# Patient Record
Sex: Male | Born: 1970
Health system: Southern US, Community
[De-identification: ages and names within clinical notes are randomized; demographics above are authoritative.]

## PROBLEM LIST (undated history)

## (undated) DIAGNOSIS — F329 Major depressive disorder, single episode, unspecified: Secondary | ICD-10-CM

## (undated) DIAGNOSIS — G4730 Sleep apnea, unspecified: Secondary | ICD-10-CM

## (undated) DIAGNOSIS — F419 Anxiety disorder, unspecified: Secondary | ICD-10-CM

## (undated) DIAGNOSIS — F32A Depression, unspecified: Secondary | ICD-10-CM

## (undated) DIAGNOSIS — E785 Hyperlipidemia, unspecified: Secondary | ICD-10-CM

## (undated) HISTORY — PX: APPENDECTOMY: SHX54

## (undated) HISTORY — DX: Anxiety disorder, unspecified: F41.9

## (undated) HISTORY — DX: Hyperlipidemia, unspecified: E78.5

## (undated) HISTORY — DX: Sleep apnea, unspecified: G47.30

## (undated) HISTORY — DX: Depression, unspecified: F32.A

## (undated) HISTORY — PX: ROTATOR CUFF REPAIR: SHX139

## (undated) HISTORY — DX: Major depressive disorder, single episode, unspecified: F32.9

---

## 2003-12-19 ENCOUNTER — Ambulatory Visit (HOSPITAL_BASED_OUTPATIENT_CLINIC_OR_DEPARTMENT_OTHER): Admission: RE | Admit: 2003-12-19 | Discharge: 2003-12-19 | Payer: Self-pay | Admitting: Orthopedic Surgery

## 2004-01-07 ENCOUNTER — Encounter (INDEPENDENT_AMBULATORY_CARE_PROVIDER_SITE_OTHER): Payer: Self-pay | Admitting: *Deleted

## 2004-01-07 ENCOUNTER — Ambulatory Visit (HOSPITAL_COMMUNITY): Admission: RE | Admit: 2004-01-07 | Discharge: 2004-01-07 | Payer: Self-pay | Admitting: Orthopedic Surgery

## 2004-01-07 ENCOUNTER — Ambulatory Visit (HOSPITAL_BASED_OUTPATIENT_CLINIC_OR_DEPARTMENT_OTHER): Admission: RE | Admit: 2004-01-07 | Discharge: 2004-01-07 | Payer: Self-pay | Admitting: Orthopedic Surgery

## 2004-01-09 HISTORY — PX: FINGER AMPUTATION: SHX636

## 2008-10-17 ENCOUNTER — Encounter: Admission: RE | Admit: 2008-10-17 | Discharge: 2008-10-17 | Payer: Self-pay | Admitting: Neurosurgery

## 2008-11-05 ENCOUNTER — Encounter: Admission: RE | Admit: 2008-11-05 | Discharge: 2008-11-05 | Payer: Self-pay | Admitting: Neurosurgery

## 2009-01-07 ENCOUNTER — Encounter: Admission: RE | Admit: 2009-01-07 | Discharge: 2009-01-07 | Payer: Self-pay | Admitting: Neurosurgery

## 2010-09-15 ENCOUNTER — Other Ambulatory Visit: Payer: Self-pay | Admitting: Orthopedic Surgery

## 2010-11-26 NOTE — Op Note (Signed)
NAME:  Dylan Hall, Dylan Hall                          ACCOUNT NO.:  [id number]   MEDICAL RECORD NO.:  [id number]                   PATIENT TYPE:  AMB   LOCATION:  DSC                                  FACILITY:  [facility]   PHYSICIAN:  Cindee Salt, M.D.                    DATE OF BIRTH:  10-03-1970   DATE OF PROCEDURE:  12/19/2003  DATE OF DISCHARGE:                                 OPERATIVE REPORT   PREOPERATIVE DIAGNOSIS:  Status post crush injury of left index finger.   POSTOPERATIVE DIAGNOSIS:  Status post crush injury of left index finger.   OPERATION PERFORMED:  Re-repair of middle phalanx fracture, left index  finger; repair of flexor sheath A4 pulley, repair of ulnar digital artery  and volar vein, left  index finger.   SURGEON:  Cindee Salt, M.D.   ASSISTANTCarolyne Fiscal.   ANESTHESIA:  Axillary block.   INDICATIONS FOR PROCEDURE:  The patient is a 40 year old male who suffered a  severe crush injury to his left index finger. He was seen elsewhere where  pinning was performed.  He was told that the finger may not survive due to  the crush.  He was seen in the office this morning with questionable  circulation to the tip of the laceration.  X-rays revealed that the fracture  is not fully reduced.   DESCRIPTION OF PROCEDURE:  The patient was brought to the operating room  where an axillary block was carried out without difficulty.  He was prepped  using Betadine scrub and solution, left arm free in supine position.  The  limb was elevated for exsanguination and an Esmarch bandage was used from  the wrist proximally.  Tourniquet placed high on the arm was inflated to 250  mmHg.  The sutures were removed.  The wound was opened.  The radial digital  artery and nerve were found to be intact as was the ulnar digital nerve.  The ulnar digital artery which was the major artery was found to be  lacerated.  The radial digital artery was extremely small.  The pins were  removed.  The fracture was  then manipulated into position, reduced and  pinned with two 35 K-wires under image intensification.  The flexor tendon  was found to be intact.  Flexor sheath was ruptured.  This was then repaired  with figure-of-eight 4-0 Mersilene sutures.  The operating microscope was  brought into position. The ends of the artery were identified on the ulnar  side.  These were cut back until normal intima was identified.  A repair was  then performed with back wall first technique with interrupted 9-0 nylon  sutures.  A large volar vein was also present.  This was cut back to normal  intimal.  Repair was then performed, again using back wall first technique  with interrupted 9-0 nylon sutures.  The tourniquet was deflated. The  tip of  the finger pinked.  The wound was irrigated and the skin closed with  interrupted 5-0 nylon sutures.  A sterile bulky dressing and splint were  applied.  The patient tolerated the procedure well and was taken to the  recovery room for observation in satisfactory condition.  He is discharged  to home to return to the [facility] [place] in one week on Percocet,  Keflex, aspirin and strict instructions to quit smoking.                                               Cindee Salt, M.D.   GK/MEDQ  D:  12/19/2003  T:  12/20/2003  Job:  [id number]

## 2010-11-26 NOTE — Op Note (Signed)
NAME:  Dylan Hall, Dylan Hall                          ACCOUNT NO.:  [id number]   MEDICAL RECORD NO.:  [id number]                   PATIENT TYPE:  AMB   LOCATION:  DSC                                  FACILITY:  [facility]   PHYSICIAN:  Cindee Salt, M.D.                    DATE OF BIRTH:  1970/10/30   DATE OF PROCEDURE:  01/07/2004  DATE OF DISCHARGE:                                 OPERATIVE REPORT   PREOPERATIVE DIAGNOSIS:  Status post crush injury, left index finger, with  revascularization failure.   POSTOPERATIVE DIAGNOSIS:  Status post crush injury, left index finger, with  revascularization failure.   OPERATION:  Revision amputation, left index finger.   SURGEON:  Cindee Salt, M.D.   ASSISTANTCarolyne Fiscal.   ANESTHESIA:  Forearm-based IV regional.   HISTORY:  The patient is a 40 year old male who suffered a crush injury to  his left index finger.  He has undergone revascularization.  This, however,  has not survived.   PROCEDURE:  The patient was brought to the operating room, where a forearm-  based IV regional anesthetic was carried out without difficulty.  He was  prepped using Duraprep in supine position, left arm free.  The limb was  exsanguinated with an Esmarch bandage, a tourniquet placed on the arm was  inflated to 250 mmHg.  The sutures were removed.  The area of demarcation  was immediately apparent.  With sharp dissection this was transected, the  entire digit removed after removal of the pins.  The wound was copiously  irrigated with saline.  Nerves were cut back and allowed to retract.  The  flexor tendons were allowed to retract.  The bone was maintained at its  present length, minimally debrided.  The wound was then loosely closed with  interrupted 4-0 chromic sutures.  A sterile compressive dressing and splint  were applied.  The patient tolerated the procedure well and was taken to the  recovery room for observation in satisfactory condition.  He is discharged  home to  return to the [facility] [place] in one week on Vicodin and  Keflex.                                               Cindee Salt, M.D.    Angelique Blonder  D:  01/07/2004  T:  01/07/2004  Job:  [id number]

## 2013-06-20 ENCOUNTER — Ambulatory Visit (INDEPENDENT_AMBULATORY_CARE_PROVIDER_SITE_OTHER): Payer: 59 | Admitting: Podiatry

## 2013-06-20 ENCOUNTER — Ambulatory Visit (INDEPENDENT_AMBULATORY_CARE_PROVIDER_SITE_OTHER): Payer: 59

## 2013-06-20 ENCOUNTER — Encounter: Payer: Self-pay | Admitting: Podiatry

## 2013-06-20 DIAGNOSIS — M7732 Calcaneal spur, left foot: Secondary | ICD-10-CM

## 2013-06-20 DIAGNOSIS — M79609 Pain in unspecified limb: Secondary | ICD-10-CM

## 2013-06-20 DIAGNOSIS — M722 Plantar fascial fibromatosis: Secondary | ICD-10-CM

## 2013-06-20 DIAGNOSIS — M7730 Calcaneal spur, unspecified foot: Secondary | ICD-10-CM

## 2013-06-20 MED ORDER — TRIAMCINOLONE ACETONIDE 10 MG/ML IJ SUSP
10.0000 mg | Freq: Once | INTRAMUSCULAR | Status: AC
  Administered 2013-06-20: 10 mg

## 2013-06-20 MED ORDER — DICLOFENAC SODIUM 75 MG PO TBEC: 75.0000 mg | DELAYED_RELEASE_TABLET | Freq: Two times a day (BID) | ORAL | Status: DC

## 2013-06-20 NOTE — Progress Notes (Signed)
   Subjective:    Patient ID: Dylan Hall, male    DOB: 1970-07-24, 42 y.o.   MRN: [id number]  HPI N heel pain        L Left plantar heel         D 6 months        O 6 years        C pain and burning, and a black spot        A ladder climbing, concrete        T old rx orthotics, and nightsplint off the internet, ice massage         Review of Systems  Constitutional: Negative.   HENT: Negative.   Eyes: Negative.   Respiratory: Negative.   Cardiovascular: Positive for leg swelling.  Gastrointestinal: Negative.   Endocrine: Negative.   Genitourinary: Negative.   Musculoskeletal: Positive for arthralgias.  Skin: Negative.   Allergic/Immunologic:       Sinus problems  Neurological: Negative.   Hematological: Negative.   Psychiatric/Behavioral: Negative.        Objective:   Physical Exam        Assessment & Plan:

## 2013-06-20 NOTE — Progress Notes (Signed)
Subjective:     Patient ID: Dylan Hall, male   DOB: [birth year], 42 y.o.   MRN: [id number]  HPI patient presents stating I have had a long-term history of heel pain left and this latest episode has been 6 months and it seems like it is getting worse. I also noted a little spot on the bottom of my left heel that I wanted you to look at   Review of Systems  All other systems reviewed and are negative.       Objective:   Physical Exam  Nursing note and vitals reviewed. Constitutional: He is oriented to person, place, and time.  Cardiovascular: Intact distal pulses.   Musculoskeletal: Normal range of motion.  Neurological: He is oriented to person, place, and time.  Skin: Skin is warm.   neurovascular status intact with muscle strength adequate and no equinus condition noted. Patient is found to have intense discomfort plantar aspect left heel at the insertional point of the tendon into the calcaneus with inflammation and fluid noted. There is a very small black spot which appears to be dried blood from probable trauma that he just noticed in the last couple days     Assessment:     Plantar fasciitis left heel with inflammation and fluid of the medial band of a long-term nature with acute condition occurring at this time    Plan:     Reviewed condition and explained to him chronic plantar fasciitis. At this time I injected the left plantar fascia 3 months Kenalog 5 of Xylocaine Marcaine mixture dispensed night splint with instructions on usage and scanned for new orthotics as his old orthotics have lost her ability to hold his arch. Placed on Voltaren 75 mg twice a day and reappoint when orthotics are returned

## 2013-07-31 ENCOUNTER — Encounter: Payer: Self-pay | Admitting: Podiatry

## 2013-07-31 ENCOUNTER — Ambulatory Visit (INDEPENDENT_AMBULATORY_CARE_PROVIDER_SITE_OTHER): Payer: 59 | Admitting: Podiatry

## 2013-07-31 ENCOUNTER — Ambulatory Visit: Payer: 59 | Admitting: Podiatry

## 2013-07-31 VITALS — BP 119/78 | HR 78 | Resp 17 | Ht 76.0 in | Wt 265.0 lb

## 2013-07-31 DIAGNOSIS — M722 Plantar fascial fibromatosis: Secondary | ICD-10-CM

## 2013-07-31 MED ORDER — TRIAMCINOLONE ACETONIDE 10 MG/ML IJ SUSP
10.0000 mg | Freq: Once | INTRAMUSCULAR | Status: AC
  Administered 2013-07-31: 10 mg

## 2013-07-31 NOTE — Progress Notes (Signed)
Subjective:     Patient ID: Dylan Hall, male   DOB: 1970-09-16, 43 y.o.   MRN: [id number]  HPI patient states that his left heel has started to hurt him again and he's been wearing his orthotics at work which have been helpful but he is having pain in his heel at this time   Review of Systems     Objective:   Physical Exam Neurovascular status intact with inflammation and pain center lateral and medial left heel    Assessment:     Severe plantar fasciitis left heel    Plan:     Discussed the importance of physical therapy and today injected the left plantar fascia 3 mg Kenalog 5 g like Marcaine mixture and discussed ice therapy night splint and orthotics. Reappoint 4 weeks

## 2013-07-31 NOTE — Progress Notes (Signed)
   Subjective:    Patient ID: Dylan Hall, male    DOB: Jun 26, 1971, 43 y.o.   MRN: [id number] Pt states the last injection pain relief did not last 2 days.  Pt complains of more burning. HPI    Review of Systems     Objective:   Physical Exam        Assessment & Plan:

## 2014-12-10 ENCOUNTER — Other Ambulatory Visit: Payer: Self-pay | Admitting: Orthopaedic Surgery

## 2014-12-10 DIAGNOSIS — M542 Cervicalgia: Secondary | ICD-10-CM

## 2015-01-01 ENCOUNTER — Ambulatory Visit
Admission: RE | Admit: 2015-01-01 | Discharge: 2015-01-01 | Disposition: A | Discharge status: Home / Self Care | Payer: 59 | Source: Ambulatory Visit | Attending: Orthopaedic Surgery | Admitting: Orthopaedic Surgery

## 2015-01-01 ENCOUNTER — Other Ambulatory Visit: Payer: Self-pay

## 2015-01-01 DIAGNOSIS — M542 Cervicalgia: Secondary | ICD-10-CM

## 2015-07-06 ENCOUNTER — Ambulatory Visit (INDEPENDENT_AMBULATORY_CARE_PROVIDER_SITE_OTHER): Payer: 59 | Admitting: Podiatry

## 2015-07-06 ENCOUNTER — Ambulatory Visit (INDEPENDENT_AMBULATORY_CARE_PROVIDER_SITE_OTHER): Payer: 59

## 2015-07-06 ENCOUNTER — Encounter: Payer: Self-pay | Admitting: Podiatry

## 2015-07-06 DIAGNOSIS — M722 Plantar fascial fibromatosis: Secondary | ICD-10-CM

## 2015-07-06 MED ORDER — TRIAMCINOLONE ACETONIDE 10 MG/ML IJ SUSP
10.0000 mg | Freq: Once | INTRAMUSCULAR | Status: AC
  Administered 2015-07-06: 10 mg

## 2015-07-06 NOTE — Progress Notes (Signed)
Subjective:     Patient ID: Dylan Hall, male   DOB: 11/12/1970, 44 y.o.   MRN: [id number]  HPI patient presents with a small lesion underneath the right arch which has been bothering him for the last several months. He does not feel it's been growing but it is tender   Review of Systems     Objective:   Physical Exam Neurovascular status intact with nodule in the mid arch area right measuring approximately 5 x 5 mm that's painful to direct pressure    Assessment:     Probable plantar fibroma with inflammatory fasciitis or possible cyst    Plan:     Injected around the area 3 mg Kenalog 5 mill grams Xylocaine and advised on heat and if it should continue to be painful should grow in size change color it will require excision reviewed x-rays with patient

## 2016-08-08 ENCOUNTER — Encounter: Payer: Self-pay | Admitting: Adult Health

## 2016-08-08 ENCOUNTER — Ambulatory Visit (INDEPENDENT_AMBULATORY_CARE_PROVIDER_SITE_OTHER): Payer: 59 | Admitting: Adult Health

## 2016-08-08 VITALS — BP 132/87 | HR 89 | Ht 74.5 in | Wt 256.2 lb

## 2016-08-08 DIAGNOSIS — E785 Hyperlipidemia, unspecified: Secondary | ICD-10-CM | POA: Insufficient documentation

## 2016-08-08 DIAGNOSIS — E663 Overweight: Secondary | ICD-10-CM | POA: Diagnosis not present

## 2016-08-08 DIAGNOSIS — E7800 Pure hypercholesterolemia, unspecified: Secondary | ICD-10-CM

## 2016-08-08 DIAGNOSIS — F17200 Nicotine dependence, unspecified, uncomplicated: Secondary | ICD-10-CM | POA: Diagnosis not present

## 2016-08-08 DIAGNOSIS — F339 Major depressive disorder, recurrent, unspecified: Secondary | ICD-10-CM

## 2016-08-08 DIAGNOSIS — Z833 Family history of diabetes mellitus: Secondary | ICD-10-CM

## 2016-08-08 DIAGNOSIS — M542 Cervicalgia: Secondary | ICD-10-CM

## 2016-08-08 MED ORDER — DULOXETINE HCL 30 MG PO CPEP: 30.0000 mg | ORAL_CAPSULE | Freq: Every day | ORAL | 0 refills | Status: DC

## 2016-08-08 MED ORDER — ROSUVASTATIN CALCIUM 20 MG PO TABS: 20.0000 mg | ORAL_TABLET | Freq: Every day | ORAL | 1 refills | Status: DC

## 2016-08-08 NOTE — Assessment & Plan Note (Signed)
Reduce saturated fat intake.  Continue medication as directed.

## 2016-08-08 NOTE — Assessment & Plan Note (Signed)
Perform ROM exercises.  OTC Ibuprofen as directed by manufacturer's instructions.

## 2016-08-08 NOTE — Assessment & Plan Note (Signed)
Declined smoking cessation at this time.

## 2016-08-08 NOTE — Patient Instructions (Addendum)
Steps to Quit Smoking Smoking tobacco can be bad for your health. It can also affect almost every organ in your body. Smoking puts you and people around you at risk for many serious long-lasting (chronic) diseases. Quitting smoking is hard, but it is one of the best things that you can do for your health. It is never too late to quit. What are the benefits of quitting smoking? When you quit smoking, you lower your risk for getting serious diseases and conditions. They can include:  Lung cancer or lung disease.  Heart disease.  Stroke.  Heart attack.  Not being able to have children (infertility).  Weak bones (osteoporosis) and broken bones (fractures). If you have coughing, wheezing, and shortness of breath, those symptoms may get better when you quit. You may also get sick less often. If you are pregnant, quitting smoking can help to lower your chances of having a baby of low birth weight. What can I do to help me quit smoking? Talk with your doctor about what can help you quit smoking. Some things you can do (strategies) include:  Quitting smoking totally, instead of slowly cutting back how much you smoke over a period of time.  Going to in-person counseling. You are more likely to quit if you go to many counseling sessions.  Using resources and support systems, such as:  Online chats with a counselor.  Phone quitlines.  Printed self-help materials.  Support groups or group counseling.  Text messaging programs.  Mobile phone apps or applications.  Taking medicines. Some of these medicines may have nicotine in them. If you are pregnant or breastfeeding, do not take any medicines to quit smoking unless your doctor says it is okay. Talk with your doctor about counseling or other things that can help you. Talk with your doctor about using more than one strategy at the same time, such as taking medicines while you are also going to in-person counseling. This can help make quitting  easier. What things can I do to make it easier to quit? Quitting smoking might feel very hard at first, but there is a lot that you can do to make it easier. Take these steps:  Talk to your family and friends. Ask them to support and encourage you.  Call phone quitlines, reach out to support groups, or work with a counselor.  Ask people who smoke to not smoke around you.  Avoid places that make you want (trigger) to smoke, such as:  Bars.  Parties.  Smoke-break areas at work.  Spend time with people who do not smoke.  Lower the stress in your life. Stress can make you want to smoke. Try these things to help your stress:  Getting regular exercise.  Deep-breathing exercises.  Yoga.  Meditating.  Doing a body scan. To do this, close your eyes, focus on one area of your body at a time from head to toe, and notice which parts of your body are tense. Try to relax the muscles in those areas.  Download or buy apps on your mobile phone or tablet that can help you stick to your quit plan. There are many free apps, such as QuitGuide from the CDC (Centers for Disease Control and Prevention). You can find more support from smokefree.gov and other websites. This information is not intended to replace advice given to you by your health care provider. Make sure you discuss any questions you have with your health care provider. Document Released: 04/23/2009 Document Revised: 02/23/2016 Document   Reviewed: 11/11/2014 Elsevier Interactive Patient Education  2017 [employer]. Cholesterol Cholesterol is a fat. Your body needs a small amount of cholesterol. Cholesterol (plaque) may build up in your blood vessels (arteries). That makes you more likely to have a heart attack or stroke. You cannot feel your cholesterol level. Having a blood test is the only way to find out if your level is high. Keep your test results. Work with your doctor to keep your cholesterol at a good level. What do the results  mean?  Total cholesterol is how much cholesterol is in your blood.  LDL is bad cholesterol. This is the type that can build up. Try to have low LDL.  HDL is good cholesterol. It cleans your blood vessels and carries LDL away. Try to have high HDL.  Triglycerides are fat that the body can store or burn for energy. What are good levels of cholesterol?  Total cholesterol below 200.  LDL below 100 is good for people who have health risks. LDL below 70 is good for people who have very high risks.  HDL above 40 is good. It is best to have HDL of 60 or higher.  Triglycerides below 150. How can I lower my cholesterol? Diet  Follow your diet program as told by your doctor.  Choose fish, white meat chicken, or [place] that is roasted or baked. Try not to eat red meat, fried foods, sausage, or lunch meats.  Eat lots of fresh fruits and vegetables.  Choose whole grains, beans, pasta, potatoes, and cereals.  Choose olive oil, corn oil, or canola oil. Only use small amounts.  Try not to eat butter, mayonnaise, shortening, or palm kernel oils.  Try not to eat foods with trans fats.  Choose low-fat or nonfat dairy foods.  Drink skim or nonfat milk.  Eat low-fat or nonfat yogurt and cheeses.  Try not to drink whole milk or cream.  Try not to eat ice cream, egg yolks, or full-fat cheeses.  Healthy desserts include angel food cake, ginger snaps, animal crackers, hard candy, popsicles, and low-fat or nonfat frozen yogurt. Try not to eat pastries, cakes, pies, and cookies. Exercise  Follow your exercise program as told by your doctor.  Be more active. Try gardening, walking, and taking the stairs.  Ask your doctor about ways that you can be more active. Medicine  Take over-the-counter and prescription medicines only as told by your doctor. This information is not intended to replace advice given to you by your health care provider. Make sure you discuss any questions you have with  your health care provider. Document Released: 09/23/2008 Document Revised: 01/27/2016 Document Reviewed: 01/07/2016 Elsevier Interactive Patient Education  2017 [employer]. Persistent Depressive Disorder Persistent depressive disorder (PDD) is a mental health condition. PDD causes symptoms of low-level depression for 2 years or longer. It may also be called long-term (chronic) depression or dysthymia. PDD may include episodes of more severe depression that last for about 2 weeks (major depressive disorder or MDD). PDD can affect the way you think, feel, and sleep. This condition may also affect your relationships. You may be more likely to get sick if you have PDD. Symptoms of PDD occur for most of the day and may include:  Feeling tired (fatigue).  Low energy.  Eating too much or too little.  Sleeping too much or too little.  Feeling restless or agitated.  Feeling hopeless.  Feeling worthless or guilty.  Feeling worried or nervous (anxiety).  Trouble concentrating or  making decisions.  Low self-esteem.  A negative way of looking at things (out[place]).  Not being able to have fun or feel pleasure.  Avoiding interacting with people.  Getting angry or annoyed easily (irritability).  Acting aggressive or angry. Follow these instructions at home: Activity  Go back to your normal activities as told by your doctor.  Exercise regularly as told by your doctor. General instructions  Take over-the-counter and prescription medicines only as told by your doctor.  Do not drink alcohol. Or, limit how much alcohol you drink to no more than 1 drink a day for nonpregnant women and 2 drinks a day for men. One drink equals 12 oz of beer, 5 oz of wine, or 1 oz of hard liquor. Alcohol can affect any antidepressant medicines you are taking. Talk with your doctor about your alcohol use.  Eat a healthy diet and get plenty of sleep.  Find activities that you enjoy each day.  Consider  joining a support group. Your doctor may be able to suggest a support group.  Keep all follow-up visits as told by your doctor. This is important. Where to find more information: [employer] on Mental Illness  www.nami.org [place] National Institute of Mental Health  https://carter.com/ National Suicide Prevention Lifeline  334-305-8528 564-196-9245). This is free, 24-hour help. Contact a doctor if:  Your symptoms get worse.  You have new symptoms.  You have trouble sleeping or doing your daily activities. Get help right away if:  You self-harm.  You have serious thoughts about hurting yourself or others.  You see, hear, taste, smell, or feel things that are not there (hallucinate). This information is not intended to replace advice given to you by your health care provider. Make sure you discuss any questions you have with your health care provider. Document Released: 06/08/2015 Document Revised: 02/19/2016 Document Reviewed: 02/19/2016 Elsevier Interactive Patient Education  2017 Else[employer] Inc.   Cervical Strain and Sprain Rehab Ask your health care provider which exercises are safe for you. Do exercises exactly as told by your health care provider and adjust them as directed. It is normal to feel mild stretching, pulling, tightness, or discomfort as you do these exercises, but you should stop right away if you feel sudden pain or your pain gets worse.Do not begin these exercises until told by your health care provider. Stretching and range of motion exercises These exercises warm up your muscles and joints and improve the movement and flexibility of your neck. These exercises also help to relieve pain, numbness, and tingling. Exercise A: Cervical side bend 1. Using good posture, sit on a stable chair or stand up. 2. Without moving your shoulders, slowly tilt your left / right ear to your shoulder until you feel a stretch in your neck muscles. You should be looking  straight ahead. 3. Hold for __________ seconds. 4. Repeat with the other side of your neck. Repeat __________ times. Complete this exercise __________ times a day. Exercise B: Cervical rotation 1. Using good posture, sit on a stable chair or stand up. 2. Slowly turn your head to the side as if you are looking over your left / right shoulder.  Keep your eyes level with the ground.  Stop when you feel a stretch along the side and the back of your neck. 3. Hold for __________ seconds. 4. Repeat this by turning to your other side. Repeat __________ times. Complete this exercise __________ times a day. Exercise C: Thoracic extension and pectoral stretch 1. Roll  a towel or a small blanket so it is about 4 inches (10 cm) in diameter. 2. Lie down on your back on a firm surface. 3. Put the towel lengthwise, under your spine in the middle of your back. It should not be not under your shoulder blades. The towel should line up with your spine from your middle back to your lower back. 4. Put your hands behind your head and let your elbows fall out to your sides. 5. Hold for __________ seconds. Repeat __________ times. Complete this exercise __________ times a day. Strengthening exercises These exercises build strength and endurance in your neck. Endurance is the ability to use your muscles for a long time, even after your muscles get tired. Exercise D: Upper cervical flexion, isometric 1. Lie on your back with a thin pillow behind your head and a small rolled-up towel under your neck. 2. Gently tuck your chin toward your chest and nod your head down to look toward your feet. Do not lift your head off the pillow. 3. Hold for __________ seconds. 4. Release the tension slowly. Relax your neck muscles completely before you repeat this exercise. Repeat __________ times. Complete this exercise __________ times a day. Exercise E: Cervical extension, isometric 1. Stand about 6 inches (15 cm) away from a  wall, with your back facing the wall. 2. Place a soft object, about 6-8 inches (15-20 cm) in diameter, between the back of your head and the wall. A soft object could be a small pillow, a ball, or a folded towel. 3. Gently tilt your head back and press into the soft object. Keep your jaw and forehead relaxed. 4. Hold for __________ seconds. 5. Release the tension slowly. Relax your neck muscles completely before you repeat this exercise. Repeat __________ times. Complete this exercise __________ times a day. Posture and body mechanics   Body mechanics refers to the movements and positions of your body while you do your daily activities. Posture is part of body mechanics. Good posture and healthy body mechanics can help to relieve stress in your body's tissues and joints. Good posture means that your spine is in its natural S-curve position (your spine is neutral), your shoulders are pulled back slightly, and your head is not tipped forward. The following are general guidelines for applying improved posture and body mechanics to your everyday activities. Standing  When standing, keep your spine neutral and keep your feet about hip-width apart. Keep a slight bend in your knees. Your ears, shoulders, and hips should line up.  When you do a task in which you stand in one place for a long time, place one foot up on a stable object that is 2-4 inches (5-10 cm) high, such as a footstool. This helps keep your spine neutral. Sitting  When sitting, keep your spine neutral and your keep feet flat on the floor. Use a footrest, if necessary, and keep your thighs parallel to the floor. Avoid rounding your shoulders, and avoid tilting your head forward.  When working at a desk or a computer, keep your desk at a height where your hands are slightly lower than your elbows. Slide your chair under your desk so you are close enough to maintain good posture.  When working at a computer, place your monitor at a height  where you are looking straight ahead and you do not have to tilt your head forward or downward to look at the screen. Resting When lying down and resting, avoid positions that are  most painful for you. Try to support your neck in a neutral position. You can use a contour pillow or a small rolled-up towel. Your pillow should support your neck but not push on it. This information is not intended to replace advice given to you by your health care provider. Make sure you discuss any questions you have with your health care provider. Document Released: 06/27/2005 Document Revised: 03/03/2016 Document Reviewed: 06/03/2015 Elsevier Interactive Patient Education  2017 [employer].   Please return for fasting labs. Continue medications as directed. Please return annually, or sooner if needed.

## 2016-08-08 NOTE — Assessment & Plan Note (Signed)
Continue current medication.  Does not request addition of CBT at this time.

## 2016-08-08 NOTE — Assessment & Plan Note (Signed)
Increase regular exercise.  Reduce fat and sugar intake.  Not interested in smoking cessation this time.

## 2016-08-08 NOTE — Assessment & Plan Note (Signed)
Reduce sugar and CHO intake.  Increase regular exercise.

## 2016-08-08 NOTE — Progress Notes (Signed)
Subjective:    Patient ID: Dylan Hall, male    DOB: [birth year], 46 y.o.   MRN: [id number]  HPI:.  Dylan Hall presents to establish as a new pt. PMH:  Chronic cervical neck pain r/t repetitive use, no acute injury/trauma.  Treated with OTC NSAIDs and cortisone injections-2010, 2016.   Depression-treated with medication and strong family support system. Elevated cholesterol-treated with medication, reports still eating foods high in saturated fat and sodium. Tobacco Use-"I am not interested in quitting right now, or probably ever". Intermittent numbness/tingling in left/right 4th/5th fingers.  Not present at today's encounter. He lives with wife of 64 years and teenage grandson.   Patient Care Team    Relationship Specialty Notifications Start End  Odella Aquas, NP PCP - General Family Medicine  08/08/16     Patient Active Problem List   Diagnosis Date Noted  . Depression, recurrent ([place]) 08/08/2016  . Elevated cholesterol 08/08/2016  . Overweight on examination 08/08/2016  . Tobacco use disorder 08/08/2016  . Neck pain 08/08/2016  . Family history of diabetes mellitus in mother 08/08/2016     Past Medical History:  Diagnosis Date  . Anxiety   . Depression   . Hyperlipidemia      Past Surgical History:  Procedure Laterality Date  . FINGER AMPUTATION Left 01/2004   index     Family History  Problem Relation Age of Onset  . Diabetes Mother   . Heart disease Mother   . Heart attack Mother   . Dementia Mother   . Alcohol abuse Father   . Hypertension Maternal Aunt   . Dementia Maternal Aunt   . Alcohol abuse Maternal Uncle   . Heart attack Maternal Uncle   . Hypertension Maternal Uncle   . Dementia Maternal Uncle   . Alcohol abuse Paternal Uncle      History  Drug Use No     History  Alcohol Use  . 1.2 oz/week  . 2 Cans of beer per week     History  Smoking Status  . Current Some Day Smoker  . Packs/day: 1.00  . Years: 26.00  . Types:  Cigarettes  Smokeless Tobacco  . Current User  . Types: Snuff     Outpatient Encounter Prescriptions as of 08/08/2016  Medication Sig  . rosuvastatin (CRESTOR) 20 MG tablet Take 1 tablet (20 mg total) by mouth daily.  . [DISCONTINUED] rosuvastatin (CRESTOR) 20 MG tablet Take 20 mg by mouth daily.  . DULoxetine (CYMBALTA) 30 MG capsule Take 1 capsule (30 mg total) by mouth daily.  . [DISCONTINUED] aspirin 81 MG tablet Take 81 mg by mouth daily.  . [DISCONTINUED] citalopram (CELEXA) 40 MG tablet Take 40 mg by mouth daily. Reported on 07/06/2015  . [DISCONTINUED] diclofenac (VOLTAREN) 75 MG EC tablet Take 1 tablet (75 mg total) by mouth 2 (two) times daily. (Patient not taking: Reported on 07/06/2015)  . [DISCONTINUED] DULoxetine (CYMBALTA) 20 MG capsule Take 20 mg by mouth daily.  . [DISCONTINUED] DULoxetine (CYMBALTA) 30 MG capsule Take 30 mg by mouth daily.   No facility-administered encounter medications on file as of 08/08/2016.     Allergies: Codeine  Body mass index is 32.45 kg/m.  Blood pressure 132/87, pulse 89, height 6' 2.5" (1.892 m), weight 256 lb 3.2 oz (116.2 kg).   Review of Systems  Constitutional: Negative for activity change, appetite change, chills, diaphoresis, fatigue, fever and unexpected weight change.  HENT: Negative for congestion, postnasal drip, trouble swallowing and  voice change.   Eyes: Negative for visual disturbance.  Respiratory: Positive for shortness of breath. Negative for apnea, cough, choking, chest tightness, wheezing and stridor.        Pack a day for 25 years.  SOB when smoking.  Denies SOB at rest.  Cardiovascular: Negative for chest pain, palpitations and leg swelling.  Gastrointestinal: Negative for constipation, diarrhea and vomiting.  Endocrine: Negative for cold intolerance, heat intolerance and polydipsia.  Genitourinary: Negative for difficulty urinating and flank pain.  Musculoskeletal: Positive for arthralgias, myalgias, neck pain  and neck stiffness. Negative for back pain, gait problem and joint swelling.  Skin: Negative for color change, pallor, rash and wound.  Neurological: Negative for dizziness, tremors, syncope, speech difficulty, weakness and headaches.  Psychiatric/Behavioral: Negative for agitation, behavioral problems, confusion, decreased concentration, dysphoric mood, self-injury, sleep disturbance and suicidal ideas. The patient is not nervous/anxious.        Objective:   Physical Exam  Constitutional: He is oriented to person, place, and time. He appears well-developed and well-nourished. No distress.  HENT:  Head: Atraumatic.  Right Ear: External ear normal.  Left Ear: External ear normal.  Eyes: Conjunctivae and EOM are normal. Pupils are equal, round, and reactive to light.  Neck: Normal range of motion. Neck supple. No tracheal deviation present. No thyromegaly present.  Tenderness with palpation over posterior cervical neck.  No step off's noted.   Cardiovascular: Normal rate, regular rhythm, normal heart sounds and intact distal pulses.   Pulmonary/Chest: Effort normal and breath sounds normal. No respiratory distress. He has no wheezes. He has no rales. He exhibits no tenderness.  Abdominal: Soft. Bowel sounds are normal.  Musculoskeletal: Normal range of motion. He exhibits tenderness and deformity.       Right shoulder: He exhibits tenderness, crepitus and pain. He exhibits no spasm, normal pulse and normal strength.       Cervical back: He exhibits tenderness and pain. He exhibits no swelling, no edema, no deformity, no laceration and no spasm.       Right hand: He exhibits no tenderness and normal capillary refill. Decreased sensation is not present in the ulnar distribution, is not present in the medial distribution and is not present in the radial distribution. He exhibits no thumb/finger opposition.       Left hand: He exhibits deformity. He exhibits no tenderness and normal capillary  refill. Decreased sensation is not present in the ulnar distribution, is not present in the medial redistribution and is not present in the radial distribution. He exhibits no thumb/finger opposition.  Bilaterally negative Phalen's sign.  Lymphadenopathy:    He has no cervical adenopathy.  Neurological: He is alert and oriented to person, place, and time. He has normal reflexes.  Skin: Skin is warm and dry. No rash noted. He is not diaphoretic. No erythema. No pallor.  Psychiatric: He has a normal mood and affect. His behavior is normal. Judgment and thought content normal.  Nursing note and vitals reviewed.   Tor Tsuda d. Bess, NP-C     Assessment & Plan:   1. Tobacco use disorder   2. Depression, recurrent ([place])   3. Elevated cholesterol   4. Overweight on examination   5. Neck pain   6. Family history of diabetes mellitus in mother     Depression, recurrent ([place]) Continue current medication.  Does not request addition of CBT at this time.  Elevated cholesterol Reduce saturated fat intake.  Continue medication as directed.  Overweight on examination Increase  regular exercise.  Reduce fat and sugar intake.  Not interested in smoking cessation this time.   Tobacco use disorder Declined smoking cessation at this time.   Neck pain Perform ROM exercises.  OTC Ibuprofen as directed by manufacturer's instructions.   Family history of diabetes mellitus in mother Reduce sugar and CHO intake.  Increase regular exercise.     FOLLOW-UP:  Return for fasting labs ASAP. Return in about 1 year (around 08/08/2017) for Regular Follow Up.

## 2016-08-17 ENCOUNTER — Other Ambulatory Visit: Payer: 59

## 2016-08-19 ENCOUNTER — Other Ambulatory Visit (INDEPENDENT_AMBULATORY_CARE_PROVIDER_SITE_OTHER): Payer: 59

## 2016-08-19 DIAGNOSIS — F17200 Nicotine dependence, unspecified, uncomplicated: Secondary | ICD-10-CM

## 2016-08-19 DIAGNOSIS — E7800 Pure hypercholesterolemia, unspecified: Secondary | ICD-10-CM | POA: Diagnosis not present

## 2016-08-19 DIAGNOSIS — F339 Major depressive disorder, recurrent, unspecified: Secondary | ICD-10-CM | POA: Diagnosis not present

## 2016-08-19 DIAGNOSIS — E663 Overweight: Secondary | ICD-10-CM

## 2016-08-19 DIAGNOSIS — M542 Cervicalgia: Secondary | ICD-10-CM

## 2016-08-19 DIAGNOSIS — Z833 Family history of diabetes mellitus: Secondary | ICD-10-CM

## 2016-08-20 LAB — COMPREHENSIVE METABOLIC PANEL
A/G RATIO: 2.4 — AB (ref 1.2–2.2)
ALK PHOS: 73 IU/L (ref 39–117)
ALT: 28 IU/L (ref 0–44)
AST: 21 IU/L (ref 0–40)
Albumin: 4.7 g/dL (ref 3.5–5.5)
BUN/Creatinine Ratio: 15 (ref 9–20)
BUN: 12 mg/dL (ref 6–24)
Bilirubin Total: 0.3 mg/dL (ref 0.0–1.2)
CALCIUM: 9.3 mg/dL (ref 8.7–10.2)
CO2: 22 mmol/L (ref 18–29)
CREATININE: 0.82 mg/dL (ref 0.76–1.27)
Chloride: 101 mmol/L (ref 96–106)
GFR calc Af Amer: 123 mL/min/{1.73_m2} (ref >59)
GFR, EST NON AFRICAN AMERICAN: 106 mL/min/{1.73_m2} (ref >59)
Globulin, Total: 2 g/dL (ref 1.5–4.5)
Glucose: 80 mg/dL (ref 65–99)
POTASSIUM: 4.3 mmol/L (ref 3.5–5.2)
Sodium: 140 mmol/L (ref 134–144)
Total Protein: 6.7 g/dL (ref 6.0–8.5)

## 2016-08-20 LAB — CBC WITH DIFFERENTIAL/PLATELET
Basophils Absolute: 0.1 10*3/uL (ref 0.0–0.2)
Basos: 1 %
EOS (ABSOLUTE): 0.6 10*3/uL — AB (ref 0.0–0.4)
Eos: 6 %
Hematocrit: 44.8 % (ref 37.5–51.0)
Hemoglobin: 15.9 g/dL (ref 13.0–17.7)
IMMATURE GRANULOCYTES: 0 %
Immature Grans (Abs): 0 10*3/uL (ref 0.0–0.1)
Lymphocytes Absolute: 3.9 10*3/uL — ABNORMAL HIGH (ref 0.7–3.1)
Lymphs: 42 %
MCH: 31.9 pg (ref 26.6–33.0)
MCHC: 35.5 g/dL (ref 31.5–35.7)
MCV: 90 fL (ref 79–97)
MONOS ABS: 0.9 10*3/uL (ref 0.1–0.9)
Monocytes: 10 %
NEUTROS PCT: 41 %
Neutrophils Absolute: 3.8 10*3/uL (ref 1.4–7.0)
PLATELETS: 226 10*3/uL (ref 150–379)
RBC: 4.99 x10E6/uL (ref 4.14–5.80)
RDW: 14.5 % (ref 12.3–15.4)
WBC: 9.3 10*3/uL (ref 3.4–10.8)

## 2016-08-20 LAB — HEMOGLOBIN A1C
ESTIMATED AVERAGE GLUCOSE: 103 mg/dL
HEMOGLOBIN A1C: 5.2 % (ref 4.8–5.6)

## 2016-08-20 LAB — VITAMIN D 25 HYDROXY (VIT D DEFICIENCY, FRACTURES): Vit D, 25-Hydroxy: 21.3 ng/mL — ABNORMAL LOW (ref 30.0–100.0)

## 2016-08-20 LAB — LIPID PANEL
CHOLESTEROL TOTAL: 147 mg/dL (ref 100–199)
Chol/HDL Ratio: 4.7 ratio units (ref 0.0–5.0)
HDL: 31 mg/dL — ABNORMAL LOW (ref >39)
LDL Calculated: 70 mg/dL (ref 0–99)
Triglycerides: 230 mg/dL — ABNORMAL HIGH (ref 0–149)
VLDL CHOLESTEROL CAL: 46 mg/dL — AB (ref 5–40)

## 2016-08-20 LAB — TSH: TSH: 3.65 u[IU]/mL (ref 0.450–4.500)

## 2016-08-22 ENCOUNTER — Other Ambulatory Visit: Payer: Self-pay | Admitting: Adult Health

## 2016-08-22 ENCOUNTER — Other Ambulatory Visit: Payer: 59

## 2016-08-22 DIAGNOSIS — E781 Pure hyperglyceridemia: Secondary | ICD-10-CM

## 2016-08-22 MED ORDER — FENOFIBRATE 48 MG PO TABS: 48.0000 mg | ORAL_TABLET | Freq: Every day | ORAL | 1 refills | Status: DC

## 2016-08-22 MED ORDER — VITAMIN D (ERGOCALCIFEROL) 1.25 MG (50000 UNIT) PO CAPS: 50000.0000 [IU] | ORAL_CAPSULE | ORAL | 0 refills | Status: DC

## 2016-08-22 NOTE — Progress Notes (Signed)
feno

## 2016-09-05 ENCOUNTER — Other Ambulatory Visit: Payer: Self-pay | Admitting: Adult Health

## 2016-10-13 ENCOUNTER — Other Ambulatory Visit: Payer: Self-pay

## 2016-10-18 ENCOUNTER — Other Ambulatory Visit: Payer: Self-pay

## 2016-10-18 MED ORDER — FENOFIBRATE 48 MG PO TABS: 48.0000 mg | ORAL_TABLET | Freq: Every day | ORAL | 0 refills | Status: DC

## 2016-10-18 MED ORDER — DULOXETINE HCL 30 MG PO CPEP: 30.0000 mg | ORAL_CAPSULE | Freq: Every day | ORAL | 0 refills | Status: DC

## 2016-12-11 ENCOUNTER — Other Ambulatory Visit: Payer: Self-pay | Admitting: Adult Health

## 2016-12-12 MED ORDER — DULOXETINE HCL 30 MG PO CPEP: 30.0000 mg | ORAL_CAPSULE | Freq: Every day | ORAL | 0 refills | Status: DC

## 2017-01-16 ENCOUNTER — Other Ambulatory Visit: Payer: Self-pay

## 2017-01-16 MED ORDER — FENOFIBRATE 48 MG PO TABS: 48.0000 mg | ORAL_TABLET | Freq: Every day | ORAL | 0 refills | Status: DC

## 2017-01-16 NOTE — Telephone Encounter (Signed)
Refill request for fenofibrate 48mg , daily.  This has not been prescribed before by you.  Please advise.

## 2017-01-16 NOTE — Telephone Encounter (Signed)
Need fasting lab draw. 30 day rx provided only until labs obtained.

## 2017-01-17 ENCOUNTER — Telehealth: Payer: Self-pay

## 2017-01-17 DIAGNOSIS — E7800 Pure hypercholesterolemia, unspecified: Secondary | ICD-10-CM

## 2017-01-17 NOTE — Telephone Encounter (Signed)
Patient aware he needs an appointment, unable to schedule at this time.

## 2017-01-17 NOTE — Telephone Encounter (Signed)
Spoke with patient.  Informed him he needs a lab appointment and follow up to continue filling medications.  Patient is driving and will call back to schedule.  Advised him we would not refill again until he is seen.  Patient understands.

## 2017-01-23 ENCOUNTER — Other Ambulatory Visit: Payer: Self-pay | Admitting: Adult Health

## 2017-01-23 DIAGNOSIS — Z79899 Other long term (current) drug therapy: Secondary | ICD-10-CM

## 2017-01-23 DIAGNOSIS — E7800 Pure hypercholesterolemia, unspecified: Secondary | ICD-10-CM

## 2017-01-23 MED ORDER — ROSUVASTATIN CALCIUM 20 MG PO TABS: 20.0000 mg | ORAL_TABLET | Freq: Every day | ORAL | 0 refills | Status: DC

## 2017-01-23 NOTE — Telephone Encounter (Signed)
Medication has been refilled for 30 days.  Patient is aware that he needs to have lab work for further refills.  Lab appointment made and orders placed.

## 2017-01-23 NOTE — Telephone Encounter (Signed)
Pt cld states he rcvd a message from [place] stating that " no more med refills till he comes in for labs & a f/u appt w/provider--- he states that is not what he discussed w/Katy at intital visit-- I advised would send note to provider to contact him. --glh

## 2017-01-30 ENCOUNTER — Other Ambulatory Visit: Payer: 59

## 2017-01-31 ENCOUNTER — Other Ambulatory Visit (INDEPENDENT_AMBULATORY_CARE_PROVIDER_SITE_OTHER): Payer: 59

## 2017-01-31 DIAGNOSIS — Z79899 Other long term (current) drug therapy: Secondary | ICD-10-CM

## 2017-01-31 DIAGNOSIS — G4733 Obstructive sleep apnea (adult) (pediatric): Secondary | ICD-10-CM | POA: Diagnosis not present

## 2017-01-31 DIAGNOSIS — E7800 Pure hypercholesterolemia, unspecified: Secondary | ICD-10-CM | POA: Diagnosis not present

## 2017-02-01 LAB — LIPID PANEL
CHOL/HDL RATIO: 5 ratio (ref 0.0–5.0)
Cholesterol, Total: 144 mg/dL (ref 100–199)
HDL: 29 mg/dL — AB (ref >39)
LDL CALC: 64 mg/dL (ref 0–99)
TRIGLYCERIDES: 256 mg/dL — AB (ref 0–149)
VLDL CHOLESTEROL CAL: 51 mg/dL — AB (ref 5–40)

## 2017-02-01 LAB — HEPATIC FUNCTION PANEL
ALT: 26 IU/L (ref 0–44)
AST: 21 IU/L (ref 0–40)
Albumin: 4.8 g/dL (ref 3.5–5.5)
Alkaline Phosphatase: 76 IU/L (ref 39–117)
Bilirubin Total: <0.2 mg/dL (ref 0.0–1.2)
Bilirubin, Direct: 0.07 mg/dL (ref 0.00–0.40)
TOTAL PROTEIN: 7.2 g/dL (ref 6.0–8.5)

## 2017-02-14 ENCOUNTER — Ambulatory Visit (INDEPENDENT_AMBULATORY_CARE_PROVIDER_SITE_OTHER): Payer: 59 | Admitting: Physical Medicine and Rehabilitation

## 2017-02-14 DIAGNOSIS — M542 Cervicalgia: Secondary | ICD-10-CM | POA: Diagnosis not present

## 2017-02-14 DIAGNOSIS — R202 Paresthesia of skin: Secondary | ICD-10-CM

## 2017-02-14 DIAGNOSIS — G4733 Obstructive sleep apnea (adult) (pediatric): Secondary | ICD-10-CM | POA: Diagnosis not present

## 2017-02-14 DIAGNOSIS — M5412 Radiculopathy, cervical region: Secondary | ICD-10-CM

## 2017-02-14 MED ORDER — CYCLOBENZAPRINE HCL 10 MG PO TABS: 10.0000 mg | ORAL_TABLET | Freq: Three times a day (TID) | ORAL | 0 refills | Status: AC | PRN

## 2017-02-14 MED ORDER — HYDROCODONE-ACETAMINOPHEN 5-325 MG PO TABS: ORAL_TABLET | ORAL | 0 refills | Status: DC

## 2017-02-14 NOTE — Progress Notes (Deleted)
Pain right side of neck and into shoulder. Has to do a lot of heavy lifting at work so pain level depends on work load for that day. Numbness in hands and into fingers.

## 2017-02-15 ENCOUNTER — Encounter (INDEPENDENT_AMBULATORY_CARE_PROVIDER_SITE_OTHER): Payer: Self-pay | Admitting: Physical Medicine and Rehabilitation

## 2017-02-15 NOTE — Progress Notes (Signed)
Dylan Hall - 46 y.o. male MRN [id number]  Date of birth: Sep 20, 1970  Office Visit Note: Visit Date: 02/14/2017 PCP: Esaw Grandchild, NP Referred by: Esaw Grandchild, NP  Subjective: Chief Complaint  Patient presents with  . Neck - Pain   HPI: Mr. Dylan Hall is a 46 year old right-hand-dominant gentleman with chronic history of neck and right radicular arm pain. He's been followed by Dr. Levada Schilling per chart in the past. We last saw him in 2016 and completed an epidural injection with some relief of his symptoms overall. He is really been doing fairly well over the last couple of years with no new injuries. He states that he manages his pain basically by taking a hydrocodone and Flexeril at various times when is very painful. The last prescription he states he had was in 2016. I have basically he is running out of medications at this 0.2 years later and having some increasing pain but with no new injury. No new symptoms. His symptoms are still pretty classic for somewhat more C7 radiculitis. He has had EMG findings of mild radiculopathy. He has cervical spine MRI dated 2016 reviewed again below. He has some myofascial pain as well. He does get some radiating symptoms into the fourth and fifth digit which don't make as much sense with his radiographic imaging and could be related to the myofascial pain and tightness of the scalene musculature. He is still working. He has not had any associated headaches. He has not had any new weakness. He has had physical therapy in the remote past but not recently. He has not had any 1 look at trigger point injections or dry needling. He doesn't feel like right now he is in really need of epidural injection although it did help with the past.    Review of Systems  Constitutional: Negative for chills, fever, malaise/fatigue and weight loss.  HENT: Negative for hearing loss and sinus pain.   Eyes: Negative for blurred vision, double vision and photophobia.    Respiratory: Negative for cough and shortness of breath.   Cardiovascular: Negative for chest pain, palpitations and leg swelling.  Gastrointestinal: Negative for abdominal pain, nausea and vomiting.  Genitourinary: Negative for flank pain.  Musculoskeletal: Positive for neck pain. Negative for myalgias.  Skin: Negative for itching and rash.  Neurological: Positive for tingling. Negative for tremors, focal weakness and weakness.  Endo/Heme/Allergies: Negative.   Psychiatric/Behavioral: Negative for depression.  All other systems reviewed and are negative.  Otherwise per HPI.  Assessment & Plan: Visit Diagnoses:  1. Cervicalgia   2. Cervical radiculopathy   3. Paresthesia of skin     Plan: Findings:  Chronic worsening severe at times but very intermittent neck and right shoulder and arm pain consistent with radiculopathy radiculitis. Imaging findings more significant see 6-7 which would be more of a C7 radicular component. EMGs were somewhat confirmative of this and he has gotten relief with epidural injection. He does have on exam myofascial pain findings with trigger points in the levator scapula and trapezius as well as teres minor. Exam does not show any shoulder impingement signs. His last prescription was provided by Aaron Edelman per chart Which was 30 tablets of Norco 5/325 as well as Flexeril. He has not had any medication since that time. I did look up his name in the [place] controlled substance database is not receiving any other controlled substances. I did refill a prescription for him for short-term hydrocodone that he can use sparingly. This  was 30 tablets. I also gave him a refill of Flexeril with 90 tablets. I will also give him a prescription for physical therapy to look at dry needling and myofascial treatment of the right parascapular neck region. We'll see him back as needed.    Meds & Orders:  Meds ordered this encounter  Medications  . HYDROcodone-acetaminophen  (NORCO/VICODIN) 5-325 MG tablet    Sig: 1 tab by mouth every 12 hours as needed for pain.    Dispense:  30 tablet    Refill:  0  . cyclobenzaprine (FLEXERIL) 10 MG tablet    Sig: Take 1 tablet (10 mg total) by mouth 3 (three) times daily as needed for muscle spasms.    Dispense:  90 tablet    Refill:  0   No orders of the defined types were placed in this encounter.   Follow-up: No Follow-up on file.   Procedures: No procedures performed  No notes on file   Clinical History: MRI CERVICAL SPINE WITHOUT CONTRAST  01/02/2015  TECHNIQUE: Multiplanar, multisequence MR imaging of the cervical spine was performed. No intravenous contrast was administered.  COMPARISON:  09/30/2008  FINDINGS: No marrow signal abnormality suggestive of fracture, infection, or neoplasm.  Normal cord signal and morphology.  No extra-spinal findings to explain. Normal flow related signal loss in the visible cervical and carotid arteries.  Degenerative changes:  C2-3: Minimal spurring at the uncovertebral joints. Patent canal and foramina.  C3-4: Minor annulus bulging.  Patent canal and foramina.  C4-5: Developing uncovertebral spurs.  No stenosis.  C5-6: Degenerative disc narrowing and uncovertebral spurring, greater on the right. Spurs have increased, as has right C6 impingement. Shallow left paracentral disc protrusion contacts the ventral cord with mild remodeling.  C6-7: Greatest level of degenerative disc narrowing, progressed from prior. Uncovertebral spurring bilaterally, greater on the right where there is advanced foraminal stenosis. Left foraminal stenosis is mild to moderate. Left-sided endplate ridging mildly remodels the left cord, stable from previous.  C7-T1:Facet arthropathy with overgrowth.  No impingement  IMPRESSION: 1. Progression of lower cervical degenerative disc disease since 2010. 2. C5-6 and C6-7 right foraminal stenosis with C6 and  C7 impingement. 3. C5-6 and C6-7 mild canal stenosis.  He reports that he has been smoking Cigarettes.  He has a 26.00 pack-year smoking history. His smokeless tobacco use includes Snuff.   Recent Labs  08/19/16 0802  HGBA1C 5.2    Objective:  VS:  HT:    WT:   BMI:     BP:   HR: bpm  TEMP: ( )  RESP:  Physical Exam  Constitutional: He is oriented to person, place, and time. He appears well-developed and well-nourished. No distress.  HENT:  Head: Normocephalic and atraumatic.  Eyes: Pupils are equal, round, and reactive to light. Conjunctivae are normal.  Neck: No tracheal deviation present.  Cardiovascular: Regular rhythm and intact distal pulses.   Pulmonary/Chest: Effort normal. No respiratory distress.  Musculoskeletal:  Patient sits with a forward flexed cervical spine. He has decreased range of motion more with right rotation than left. He has pain with extension and forward flexion. He has a negative Spurling's test does seem to be irritating a little bit. He has no real shoulder impingement signs bilaterally. He has focal trigger points in the trapezius as well as levator scapula and teres minor. These do reproduce a lot of his pain. He has full upper extremity strength symmetrically and bilaterally. He does have a left first digit amputation. He  has normal intact sensation bilaterally. He has 2+ muscle stretch reflexes at the biceps and brachioradialis.  Lymphadenopathy:    He has no cervical adenopathy.  Neurological: He is alert and oriented to person, place, and time. He exhibits normal muscle tone.  Skin: Skin is warm and dry. No rash noted. No erythema.  Psychiatric: He has a normal mood and affect.  Nursing note and vitals reviewed.   Ortho Exam Imaging: No results found.  Past Medical/Family/Surgical/Social History: Medications & Allergies reviewed per EMR Patient Active Problem List   Diagnosis Date Noted  . Depression, recurrent ([place]) 08/08/2016  .  Elevated cholesterol 08/08/2016  . Overweight on examination 08/08/2016  . Tobacco use disorder 08/08/2016  . Neck pain 08/08/2016  . Family history of diabetes mellitus in mother 08/08/2016   Past Medical History:  Diagnosis Date  . Anxiety   . Depression   . Hyperlipidemia    Family History  Problem Relation Age of Onset  . Diabetes Mother   . Heart disease Mother   . Heart attack Mother   . Dementia Mother   . Alcohol abuse Father   . Hypertension Maternal Aunt   . Dementia Maternal Aunt   . Alcohol abuse Maternal Uncle   . Heart attack Maternal Uncle   . Hypertension Maternal Uncle   . Dementia Maternal Uncle   . Alcohol abuse Paternal Uncle    Past Surgical History:  Procedure Laterality Date  . FINGER AMPUTATION Left 01/2004   index   Social History   Occupational History  . Not on file.   Social History Main Topics  . Smoking status: Current Some Day Smoker    Packs/day: 1.00    Years: 26.00    Types: Cigarettes  . Smokeless tobacco: Current User    Types: Snuff  . Alcohol use 1.2 oz/week    2 Cans of beer per week  . Drug use: No  . Sexual activity: Yes    Birth control/ protection: None

## 2017-02-15 NOTE — Progress Notes (Deleted)
Pain right side of neck and into shoulder. Has to do a lot of heavy lifting at work so pain level depends on work load for that day. Numbness in hands and into fingers.

## 2017-02-25 ENCOUNTER — Other Ambulatory Visit: Payer: Self-pay | Admitting: Adult Health

## 2017-02-27 ENCOUNTER — Telehealth: Payer: Self-pay | Admitting: Adult Health

## 2017-02-27 NOTE — Telephone Encounter (Signed)
Advised pt that his RX was denied because he was supposed to have f/u appointment to discuss lipids and he has not done that, nor does he have an upcoming appointment scheduled.  Advised pt that he has been informed of need for OV several times and he has not complied.  Pt expressed understanding and was transferred to front desk to schedule OV.  Charyl Bigger, CMA

## 2017-02-27 NOTE — Telephone Encounter (Signed)
Pt's wife called states [facility] notified them refill on fenofibrate 48 MG needs dr Ok Edwards  Before it can be refilled--they want to know why & pt also states they are going out of town on Wednesday an needs the med. --glh

## 2017-02-28 ENCOUNTER — Other Ambulatory Visit: Payer: Self-pay

## 2017-02-28 ENCOUNTER — Encounter: Payer: Self-pay | Admitting: Adult Health

## 2017-02-28 ENCOUNTER — Ambulatory Visit (INDEPENDENT_AMBULATORY_CARE_PROVIDER_SITE_OTHER): Payer: 59 | Admitting: Adult Health

## 2017-02-28 DIAGNOSIS — E7800 Pure hypercholesterolemia, unspecified: Secondary | ICD-10-CM | POA: Diagnosis not present

## 2017-02-28 DIAGNOSIS — F17200 Nicotine dependence, unspecified, uncomplicated: Secondary | ICD-10-CM | POA: Diagnosis not present

## 2017-02-28 MED ORDER — FENOFIBRATE 120 MG PO TABS: 120.0000 mg | ORAL_TABLET | Freq: Every day | ORAL | 2 refills | Status: DC

## 2017-02-28 MED ORDER — DULOXETINE HCL 30 MG PO CPEP: 30.0000 mg | ORAL_CAPSULE | Freq: Every day | ORAL | 2 refills | Status: DC

## 2017-02-28 MED ORDER — ROSUVASTATIN CALCIUM 20 MG PO TABS: 20.0000 mg | ORAL_TABLET | Freq: Every day | ORAL | 2 refills | Status: DC

## 2017-02-28 NOTE — Progress Notes (Signed)
Patient called stating that fenofibrate is on back order and his pharmacy and requests printed RX so that he may try to find it at another pharmacy.  RX printed and left at front desk for patient pick up.  Charyl Bigger, CMA

## 2017-02-28 NOTE — Assessment & Plan Note (Addendum)
08/19/16 Ref Range & Units 66mo ago  Cholesterol, Total 100 - 199 mg/dL 147   Triglycerides 0 - 149 mg/dL 230    HDL >39 mg/dL 31    VLDL Cholesterol Cal 5 - 40 mg/dL 46    LDL Calculated 0 - 99 mg/dL 70   Chol/HDL Ratio 0.0 - 5.0 ratio units 4.7   Taking Rosuvastatin 20mg  and added Fenofibrate 48mg  daily.  Ref Range & Units 4wk ago 01/31/17  Cholesterol, Total 100 - 199 mg/dL 144   Triglycerides 0 - 149 mg/dL 256    HDL >39 mg/dL 29    VLDL Cholesterol Cal 5 - 40 mg/dL 51    LDL Calculated 0 - 99 mg/dL 64   Chol/HDL Ratio 0.0 - 5.0 ratio 5.0   Continued on Rosuvastatin 20mg  and increased Fenofibrate to 120mg  daily. Instructed to follow heart healthy diet and reduce CHO/sugar intake. Will re-check lipids in 4 months.

## 2017-02-28 NOTE — Assessment & Plan Note (Signed)
Continues to smoke >pack/day. Declined smoking cessation today.

## 2017-02-28 NOTE — Patient Instructions (Signed)
Heart-Healthy Eating Plan Many factors influence your heart health, including eating and exercise habits. Heart (coronary) risk increases with abnormal blood fat (lipid) levels. Heart-healthy meal planning includes limiting unhealthy fats, increasing healthy fats, and making other small dietary changes. This includes maintaining a healthy body weight to help keep lipid levels within a normal range. What is my plan? Your health care provider recommends that you:  Get no more than __25___% of the total calories in your daily diet from fat.  Limit your intake of saturated fat to less than __5___% of your total calories each day.  Limit the amount of cholesterol in your diet to less than __300__ mg per day.  What types of fat should I choose?  Choose healthy fats more often. Choose monounsaturated and polyunsaturated fats, such as olive oil and canola oil, flaxseeds, walnuts, almonds, and seeds.  Eat more omega-3 fats. Good choices include salmon, mackerel, sardines, tuna, flaxseed oil, and ground flaxseeds. Aim to eat fish at least two times each week.  Limit saturated fats. Saturated fats are primarily found in animal products, such as meats, butter, and cream. Plant sources of saturated fats include palm oil, palm kernel oil, and coconut oil.  Avoid foods with partially hydrogenated oils in them. These contain trans fats. Examples of foods that contain trans fats are stick margarine, some tub margarines, cookies, crackers, and other baked goods. What general guidelines do I need to follow?  Check food labels carefully to identify foods with trans fats or high amounts of saturated fat.  Fill one half of your plate with vegetables and green salads. Eat 4-5 servings of vegetables per day. A serving of vegetables equals 1 cup of raw leafy vegetables,  cup of raw or cooked cut-up vegetables, or  cup of vegetable juice.  Fill one fourth of your plate with whole grains. Look for the word "whole"  as the first word in the ingredient list.  Fill one fourth of your plate with lean protein foods.  Eat 4-5 servings of fruit per day. A serving of fruit equals one medium whole fruit,  cup of dried fruit,  cup of fresh, frozen, or canned fruit, or  cup of 100% fruit juice.  Eat more foods that contain soluble fiber. Examples of foods that contain this type of fiber are apples, broccoli, carrots, beans, peas, and barley. Aim to get 20-30 g of fiber per day.  Eat more home-cooked food and less restaurant, buffet, and fast food.  Limit or avoid alcohol.  Limit foods that are high in starch and sugar.  Avoid fried foods.  Cook foods by using methods other than frying. Baking, boiling, grilling, and broiling are all great options. Other fat-reducing suggestions include: ? Removing the skin from poultry. ? Removing all visible fats from meats. ? Skimming the fat off of stews, soups, and gravies before serving them. ? Steaming vegetables in water or broth.  Lose weight if you are overweight. Losing just 5-10% of your initial body weight can help your overall health and prevent diseases such as diabetes and heart disease.  Increase your consumption of nuts, legumes, and seeds to 4-5 servings per week. One serving of dried beans or legumes equals  cup after being cooked, one serving of nuts equals 1 ounces, and one serving of seeds equals  ounce or 1 tablespoon.  You may need to monitor your salt (sodium) intake, especially if you have high blood pressure. Talk with your health care provider or dietitian to get  more information about reducing sodium. What foods can I eat? Grains  Breads, including [place], white, pita, wheat, raisin, rye, oatmeal, and [place]. Tortillas that are neither fried nor made with lard or trans fat. Low-fat rolls, including hotdog and hamburger buns and English muffins. Biscuits. Muffins. Waffles. Pancakes. Light popcorn. Whole-grain cereals. Flatbread. Melba  toast. Pretzels. Breadsticks. Rusks. Low-fat snacks and crackers, including oyster, saltine, matzo, graham, animal, and rye. Rice and pasta, including brown rice and those that are made with whole wheat. Vegetables All vegetables. Fruits All fruits, but limit coconut. Meats and Other Protein Sources Lean, well-trimmed beef, veal, pork, and lamb. Chicken and [place] without skin. All fish and shellfish. Wild duck, rabbit, pheasant, and venison. Egg whites or low-cholesterol egg substitutes. Dried beans, peas, lentils, and tofu.Seeds and most nuts. Dairy Low-fat or nonfat cheeses, including ricotta, string, and mozzarella. Skim or 1% milk that is liquid, powdered, or evaporated. Buttermilk that is made with low-fat milk. Nonfat or low-fat yogurt. Beverages Mineral water. Diet carbonated beverages. Sweets and Desserts Sherbets and fruit ices. Honey, jam, marmalade, jelly, and syrups. Meringues and gelatins. Pure sugar candy, such as hard candy, jelly beans, gumdrops, mints, marshmallows, and small amounts of dark chocolate. [employer] Eat all sweets and desserts in moderation. Fats and Oils Nonhydrogenated (trans-free) margarines. Vegetable oils, including soybean, sesame, sunflower, olive, peanut, safflower, corn, canola, and cottonseed. Salad dressings or mayonnaise that are made with a vegetable oil. Limit added fats and oils that you use for cooking, baking, salads, and as spreads. Other Cocoa powder. Coffee and tea. All seasonings and condiments. The items listed above may not be a complete list of recommended foods or beverages. Contact your dietitian for more options. What foods are not recommended? Grains Breads that are made with saturated or trans fats, oils, or whole milk. Croissants. Butter rolls. Cheese breads. Sweet rolls. Donuts. Buttered popcorn. Chow mein noodles. High-fat crackers, such as cheese or butter crackers. Meats and Other Protein Sources Fatty meats, such as  hotdogs, short ribs, sausage, spareribs, bacon, ribeye roast or steak, and mutton. High-fat deli meats, such as salami and bologna. Caviar. Domestic duck and goose. Organ meats, such as kidney, liver, sweetbreads, brains, gizzard, chitterlings, and heart. Dairy Cream, sour cream, cream cheese, and creamed cottage cheese. Whole milk cheeses, including blue (bleu), Monte[place] Jack, [place], [place], American, [place], [place], [place], [place], and [place]. Whole or 2% milk that is liquid, evaporated, or condensed. Whole buttermilk. Cream sauce or high-fat cheese sauce. Yogurt that is made from whole milk. Beverages Regular sodas and drinks with added sugar. Sweets and Desserts Frosting. Pudding. Cookies. Cakes other than angel food cake. Candy that has milk chocolate or white chocolate, hydrogenated fat, butter, coconut, or unknown ingredients. Buttered syrups. Full-fat ice cream or ice cream drinks. Fats and Oils Gravy that has suet, meat fat, or shortening. Cocoa butter, hydrogenated oils, palm oil, coconut oil, palm kernel oil. These can often be found in baked products, candy, fried foods, nondairy creamers, and whipped toppings. Solid fats and shortenings, including bacon fat, salt pork, lard, and butter. Nondairy cream substitutes, such as coffee creamers and sour cream substitutes. Salad dressings that are made of unknown oils, cheese, or sour cream. The items listed above may not be a complete list of foods and beverages to avoid. Contact your dietitian for more information. This information is not intended to replace advice given to you by your health care provider. Make sure you discuss any questions you have with your health care  provider. Document Released: 04/05/2008 Document Revised: 01/15/2016 Document Reviewed: 12/19/2013 Elsevier Interactive Patient Education  2017 [place].  Reduce sweet tea/sugar/carbohydrate intake. Continue excellent walking routine at work, increase walking at home  or even in adding biking, swimming, other forms of cardio. Increased Fenofibrate 120mg  daily.  Increase water intake, strive for at least 90 ounces day. Continue all other medications as directed. Return in 4 months for nurse visit-fasting labs. Return in 6 months for full physical.

## 2017-02-28 NOTE — Progress Notes (Signed)
Subjective:    Patient ID: Dylan Hall, male    DOB: 12/03/1970, 46 y.o.   MRN: [id number]  HPI:  Dylan Hall is here for f/u:  HL, tobacco use disorder. He has been taking rosuvastatin 20mg  and Fenofibrate 48mg  daily, denies muscle cramps.  He continues to eat a diet high in saturated fat/CHO/sugar-he reports drinking sweet tea/diet sodas all day with very minimal water.  He continues to smoke >pack a day and again declined smoking cessation today. He estimates to walk 8-74miles a day when working 3-5 days a week.  He reports stable mood and denies SE from Cymbalta.   Wife at [facility] during [place].  Patient Care Team    Relationship Specialty Notifications Start End  Esaw Grandchild, NP PCP - General Family Medicine  08/08/16     Patient Active Problem List   Diagnosis Date Noted  . Depression, recurrent ([place]) 08/08/2016  . Elevated cholesterol 08/08/2016  . Overweight on examination 08/08/2016  . Tobacco use disorder 08/08/2016  . Neck pain 08/08/2016  . Family history of diabetes mellitus in mother 08/08/2016     Past Medical History:  Diagnosis Date  . Anxiety   . Depression   . Hyperlipidemia      Past Surgical History:  Procedure Laterality Date  . FINGER AMPUTATION Left 01/2004   index     Family History  Problem Relation Age of Onset  . Diabetes Mother   . Heart disease Mother   . Heart attack Mother   . Dementia Mother   . Alcohol abuse Father   . Hypertension Maternal Aunt   . Dementia Maternal Aunt   . Alcohol abuse Maternal Uncle   . Heart attack Maternal Uncle   . Hypertension Maternal Uncle   . Dementia Maternal Uncle   . Alcohol abuse Paternal Uncle      History  Drug Use No     History  Alcohol Use  . 1.2 oz/week  . 2 Cans of beer per week     History  Smoking Status  . Current Some Day Smoker  . Packs/day: 1.00  . Years: 26.00  . Types: Cigarettes  Smokeless Tobacco  . Current User  . Types: Snuff     Outpatient Encounter  Prescriptions as of 02/28/2017  Medication Sig  . cyclobenzaprine (FLEXERIL) 10 MG tablet Take 1 tablet (10 mg total) by mouth 3 (three) times daily as needed for muscle spasms.  . DULoxetine (CYMBALTA) 30 MG capsule Take 1 capsule (30 mg total) by mouth daily.  . Fenofibrate 120 MG TABS Take 1 tablet (120 mg total) by mouth daily.  Marland Kitchen HYDROcodone-acetaminophen (NORCO/VICODIN) 5-325 MG tablet 1 tab by mouth every 12 hours as needed for pain.  . rosuvastatin (CRESTOR) 20 MG tablet Take 1 tablet (20 mg total) by mouth daily.  . [DISCONTINUED] DULoxetine (CYMBALTA) 30 MG capsule Take 1 capsule (30 mg total) by mouth daily.  . [DISCONTINUED] fenofibrate (TRICOR) 48 MG tablet Take 1 tablet (48 mg total) by mouth daily.  . [DISCONTINUED] rosuvastatin (CRESTOR) 20 MG tablet Take 1 tablet (20 mg total) by mouth daily.  . [DISCONTINUED] Vitamin D, Ergocalciferol, (DRISDOL) 50000 units CAPS capsule Take 1 capsule (50,000 Units total) by mouth every 7 (seven) days.   No facility-administered encounter medications on file as of 02/28/2017.     Allergies: Codeine  Body mass index is 31.97 kg/m.  Blood pressure 121/79, pulse 76, height 6' 2.5" (1.892 m), weight 252 lb 6.4 oz (  114.5 kg).     Review of Systems  Constitutional: Positive for fatigue. Negative for activity change, appetite change, chills, diaphoresis, fever and unexpected weight change.  Eyes: Negative for visual disturbance.  Respiratory: Negative for cough, chest tightness, shortness of breath, wheezing and stridor.   Cardiovascular: Negative for chest pain, palpitations and leg swelling.  Endocrine: Negative for cold intolerance, heat intolerance, polydipsia, polyphagia and polyuria.  Genitourinary: Negative for difficulty urinating, flank pain and hematuria.  Musculoskeletal: Negative for myalgias.  Neurological: Negative for dizziness, tremors and weakness.  Hematological: Does not bruise/bleed easily.  Psychiatric/Behavioral:  Negative for decreased concentration, self-injury, sleep disturbance and suicidal ideas. The patient is not nervous/anxious and is not hyperactive.        Objective:   Physical Exam  Constitutional: He is oriented to person, place, and time. He appears well-developed and well-nourished. No distress.  HENT:  Head: Normocephalic and atraumatic.  Right Ear: External ear normal.  Left Ear: External ear normal.  Eyes: Pupils are equal, round, and reactive to light. Conjunctivae are normal.  Cardiovascular: Normal rate, regular rhythm, normal heart sounds and intact distal pulses.   No murmur heard. Pulmonary/Chest: Effort normal and breath sounds normal.  Neurological: He is alert and oriented to person, place, and time. Coordination normal.  Skin: Skin is warm and dry. No rash noted. He is not diaphoretic. No erythema. No pallor.  Psychiatric: He has a normal mood and affect. His behavior is normal. Judgment and thought content normal.  Nursing note and vitals reviewed.         Assessment & Plan:   1. Elevated cholesterol   2. Tobacco use disorder     Elevated cholesterol 08/19/16 Ref Range & Units 52mo ago  Cholesterol, Total 100 - 199 mg/dL 147   Triglycerides 0 - 149 mg/dL 230    HDL >39 mg/dL 31    VLDL Cholesterol Cal 5 - 40 mg/dL 46    LDL Calculated 0 - 99 mg/dL 70   Chol/HDL Ratio 0.0 - 5.0 ratio units 4.7   Taking Rosuvastatin 20mg  and added Fenofibrate 48mg  daily.  Ref Range & Units 4wk ago 01/31/17  Cholesterol, Total 100 - 199 mg/dL 144   Triglycerides 0 - 149 mg/dL 256    HDL >39 mg/dL 29    VLDL Cholesterol Cal 5 - 40 mg/dL 51    LDL Calculated 0 - 99 mg/dL 64   Chol/HDL Ratio 0.0 - 5.0 ratio 5.0   Continued on Rosuvastatin 20mg  and increased Fenofibrate to 120mg  daily. Instructed to follow heart healthy diet and reduce CHO/sugar intake. Will re-check lipids in 4 months.   Tobacco use disorder Continues to smoke >pack/day. Declined smoking cessation  today.    FOLLOW-UP:  Return in about 6 months (around 08/31/2017) for CPE.

## 2017-03-21 ENCOUNTER — Telehealth: Payer: Self-pay | Admitting: Adult Health

## 2017-03-21 ENCOUNTER — Other Ambulatory Visit: Payer: Self-pay | Admitting: Adult Health

## 2017-03-21 NOTE — Telephone Encounter (Signed)
Per pharmacy, cost for fenofibrate 48mg  is $45.99 an120mg  is $800.99 for 30 day supply.  Cost of 145mg  #30 is $126.99

## 2017-03-21 NOTE — Telephone Encounter (Signed)
Please advise.  T. Nelson, CMA 

## 2017-03-21 NOTE — Telephone Encounter (Signed)
Afternoon Tonya, Can you please call the pharmacy and get the prices for fenofibrate 48mg  and 120mg . Thanks! Valetta Fuller

## 2017-03-21 NOTE — Telephone Encounter (Signed)
Pt's wife called states pt's Rx fenofibrite 120MG  tablets are to expensive (take 1 daily)--Pt request either a cheaper form or return to the prior dosage (lesser MG) --Pt uses [facility] on [address]. --glh

## 2017-03-21 NOTE — Telephone Encounter (Signed)
Afternoon Tonya, GREAT JOB on running down the cost per dosage for Fenofibrate. Due to cost he can continue on Fenofibrate 48mg  daily but he REALLY needs to increase exercise and follow heart healthy diet to improve his chol panel. If he is agreeable to this then I will update system to keep him on 48mg  dosage. Thanks! Valetta Fuller

## 2017-03-21 NOTE — Telephone Encounter (Signed)
03/21/17  Pt's spouse informed.  She expressed understanding and is agreeable.  Charyl Bigger, CMA

## 2017-03-22 ENCOUNTER — Telehealth: Payer: Self-pay | Admitting: Adult Health

## 2017-03-22 MED ORDER — FENOFIBRATE 48 MG PO TABS: 48.0000 mg | ORAL_TABLET | Freq: Every day | ORAL | 0 refills | Status: DC

## 2017-03-22 NOTE — Addendum Note (Signed)
Addended by: Fonnie Mu on: 03/22/2017 12:29 PM   Modules accepted: Orders

## 2017-03-22 NOTE — Telephone Encounter (Signed)
Patient called asking for an update on getting new fenofibrate prescription sent to [facility]

## 2017-03-22 NOTE — Telephone Encounter (Signed)
Informed pt that refill for 48mg  fenofibrate was sent to pharmacy.  Charyl Bigger, CMA

## 2017-05-25 DIAGNOSIS — G4733 Obstructive sleep apnea (adult) (pediatric): Secondary | ICD-10-CM | POA: Diagnosis not present

## 2017-05-26 ENCOUNTER — Encounter: Payer: Self-pay | Admitting: Family Medicine

## 2017-05-26 ENCOUNTER — Ambulatory Visit (INDEPENDENT_AMBULATORY_CARE_PROVIDER_SITE_OTHER): Payer: 59 | Admitting: Family Medicine

## 2017-05-26 VITALS — BP 130/84 | HR 80 | Ht 74.5 in | Wt 264.4 lb

## 2017-05-26 DIAGNOSIS — L720 Epidermal cyst: Secondary | ICD-10-CM

## 2017-05-26 DIAGNOSIS — L0211 Cutaneous abscess of neck: Secondary | ICD-10-CM | POA: Diagnosis not present

## 2017-05-26 DIAGNOSIS — L0390 Cellulitis, unspecified: Secondary | ICD-10-CM

## 2017-05-26 MED ORDER — SULFAMETHOXAZOLE-TRIMETHOPRIM 800-160 MG PO TABS: 1.0000 | ORAL_TABLET | Freq: Two times a day (BID) | ORAL | 0 refills | Status: DC

## 2017-05-26 NOTE — Patient Instructions (Signed)
If left untreated, benign cysts can cause serious complications including: Infection - the cyst fills with bacteria and pus, and becomes an abscess. If the abscess bursts inside the body, there is a risk of blood poisoning (septicaemia).  We are treating you for an infected sebaceous cyst.  Please use the antibiotics as written.  Skin Cyst: Home Treatment Home treatment for a lump, such as an epidermal (skin) cyst, may relieve symptoms but may not make the cyst go away. An epidermal cyst is a small, round lump in the top layer of skin called the epidermis. It may be filled with a soft, yellow substance called keratin. Epidermal cysts most often appear on the face, ears, back, or chest. But they can appear on almost any part of the body.  When you have an epidermal cyst, the lump or bump under the skin is: Small, round, and smooth. About the size of a pea, or a little smaller or larger. Yellow, white, or skin-colored. It can turn red if it becomes inflamed. Painless. But it can be painful if it's inflamed.  To treat a lump that may be caused by infection under the skin:  --- Do not squeeze, scratch, drain, open (lance), or puncture the lump. Doing this can irritate or inflame the lump, push any existing infection deeper into the skin, or cause severe bleeding. ---- Keep the area clean by washing the lump and surrounding skin well with soap. --- Apply warm, wet washcloths to the lump for 20 to 30 minutes, 3 to 4 times a day. If you prefer, you can also use a hot water bottle or heating pad over a damp towel. The heat and moisture can soothe the lump, increase blood circulation to the area, and speed healing. It can also bring a lump caused by infection to a head (but it may take 5 to 7 days). Be careful not to burn your skin. Do not use water that is warmer than bath water. If the lump begins to drain pus, apply a bandage to keep the draining material from spreading. Change the bandage daily. If a  large amount of pus drains from the lump, or the lump becomes more red or painful, evaluation by a doctor may be needed.     Epidermal Cyst An epidermal cyst is sometimes called an epidermal inclusion cyst or an infundibular cyst. It is a sac made of skin tissue. The sac contains a substance called keratin. Keratin is a protein that is normally secreted through the hair follicles. When keratin becomes trapped in the top layer of skin (epidermis), it can form an epidermal cyst. Epidermal cysts are usually found on the face, neck, trunk, and genitals. These cysts are usually harmless (benign), and they may not cause symptoms unless they become infected. It is important not to pop epidermal cysts yourself. What are the causes? This condition may be caused by:  A blocked hair follicle.  A hair that curls and re-enters the skin instead of growing straight out of the skin (ingrown hair).  A blocked pore.  Irritated skin.  An injury to the skin.  Certain conditions that are passed along from parent to child (inherited).  Human papillomavirus (HPV).  What increases the risk? The following factors may make you more likely to develop an epidermal cyst:  Having acne.  Being overweight.  Wearing tight clothing.  What are the signs or symptoms? The only symptom of this condition may be a small, painless lump underneath the skin. When  an epidermal cyst becomes infected, symptoms may include:  Redness.  Inflammation.  Tenderness.  Warmth.  Fever.  Keratin draining from the cyst. Keratin may look like a grayish-white, bad-smelling substance.  Pus draining from the cyst.  How is this diagnosed? This condition is diagnosed with a physical exam. In some cases, you may have a sample of tissue (biopsy) taken from your cyst to be examined under a microscope or tested for bacteria. You may be referred to a health care provider who specializes in skin care (dermatologist). How is this  treated? In many cases, epidermal cysts go away on their own without treatment. If a cyst becomes infected, treatment may include:  Opening and draining the cyst. After draining, minor surgery to remove the rest of the cyst may be done.  Antibiotic medicine to help prevent infection.  Injections of medicines (steroids) that help to reduce inflammation.  Surgery to remove the cyst. Surgery may be done if: ? The cyst becomes large. ? The cyst bothers you. ? There is a chance that the cyst could turn into cancer.  Follow these instructions at home:  Take over-the-counter and prescription medicines only as told by your health care provider.  If you were prescribed an antibiotic, use it as told by your health care provider. Do not stop using the antibiotic even if you start to feel better.  Keep the area around your cyst clean and dry.  Wear loose, dry clothing.  Do not try to pop your cyst.  Avoid touching your cyst.  Check your cyst every day for signs of infection.  Keep all follow-up visits as told by your health care provider. This is important. How is this prevented?  Wear clean, dry, clothing.  Avoid wearing tight clothing.  Keep your skin clean and dry. Shower or take baths every day.  Wash your body with a benzoyl peroxide wash when you shower or bathe. Contact a health care provider if:  Your cyst develops symptoms of infection.  Your condition is not improving or is getting worse.  You develop a cyst that looks different from other cysts you have had.  You have a fever. Get help right away if:  Redness spreads from the cyst into the surrounding area. This information is not intended to replace advice given to you by your health care provider. Make sure you discuss any questions you have with your health care provider. Document Released: 05/28/2004 Document Revised: 02/24/2016 Document Reviewed: 04/29/2015 Elsevier Interactive Patient Education  [employer].

## 2017-05-26 NOTE — Progress Notes (Signed)
Pt here for an acute care OV today   Impression and Recommendations:    1. Cutaneous abscess of neck   2. Cellulitis, unspecified cellulitis site   3. Epidermal cyst-now infected    See AVS for detailed on what I discussed with patient which is to use warm compresses, do not pop pinch or mess with it.  Explained it may form ahead and may drain.  Just keep it clean dry and covered.  Take all antibiotics until gone.  All patient's questions were answered.  1. Cutaneous abscess of neck - sulfamethoxazole-trimethoprim (BACTRIM DS,SEPTRA DS) 800-160 MG tablet; Take 1 tablet 2 (two) times daily by mouth.  2. Cellulitis, unspecified cellulitis site - sulfamethoxazole-trimethoprim (BACTRIM DS,SEPTRA DS) 800-160 MG tablet; Take 1 tablet 2 (two) times daily by mouth.  3. Epidermal cyst-now infected   Education and routine counseling performed. Handouts provided  Gross side effects, risk and benefits, and alternatives of medications and treatment plan in general discussed with patient.  Patient is aware that all medications have potential side effects and we are unable to predict every side effect or drug-drug interaction that may occur.   Patient will call with any questions prior to using medication if they have concerns.  Expresses verbal understanding and consents to current therapy and treatment regimen.  No barriers to understanding were identified.  Red flag symptoms and signs discussed in detail.  Patient expressed understanding regarding what to do in case of emergency\urgent symptoms  Please see AVS handed out to patient at the end of our visit for further patient instructions/ counseling done pertaining to today's office visit.   No Follow-up on file.     Note: This document was prepared using Dragon voice recognition software and may include unintentional dictation errors.  Mellody Dance 9:46  AM --------------------------------------------------------------------------------------------------------------------------------------------------------------------------------------------------------------------------------------------    Subjective:    CC:  Chief Complaint  Patient presents with  . Abscess    neck x 1 month     HPI: Dylan Hall is a 46 y.o. male who presents to [facility] today for issues as discussed below.  Patient known for folliculitis of his beard region in the neck.  He has had a folliculitis for a couple of months which last week grew bigger.  He has been trying to pop it and it has been growing bigger and bigger notes red warm and tender to touch.  No fever chills, nausea vomiting diarrhea.  Uncertain if he ever had MRSA - smoker   Wt Readings from Last 3 Encounters:  05/26/17 264 lb 6.4 oz (119.9 kg)  02/28/17 252 lb 6.4 oz (114.5 kg)  08/08/16 256 lb 3.2 oz (116.2 kg)   BP Readings from Last 3 Encounters:  05/26/17 130/84  02/28/17 121/79  08/08/16 132/87   BMI Readings from Last 3 Encounters:  05/26/17 33.49 kg/m  02/28/17 31.97 kg/m  08/08/16 32.45 kg/m     Patient Care Team    Relationship Specialty Notifications Start End  Esaw Grandchild, NP PCP - General Family Medicine  08/08/16      Patient Active Problem List   Diagnosis Date Noted  . Depression, recurrent ([place]) 08/08/2016  . Elevated cholesterol 08/08/2016  . Overweight on examination 08/08/2016  . Tobacco use disorder 08/08/2016  . Neck pain 08/08/2016  . Family history of diabetes mellitus in mother 08/08/2016    Past Medical history, Surgical history, Family history, Social history, Allergies and Medications have been entered into  the medical record, reviewed and changed as needed.    Current Meds  Medication Sig  . cyclobenzaprine (FLEXERIL) 10 MG tablet Take 1 tablet (10 mg total) by mouth 3 (three) times daily as needed for  muscle spasms.  . DULoxetine (CYMBALTA) 30 MG capsule Take 1 capsule (30 mg total) by mouth daily.  . fenofibrate (TRICOR) 48 MG tablet Take 1 tablet (48 mg total) by mouth daily.  Marland Kitchen HYDROcodone-acetaminophen (NORCO/VICODIN) 5-325 MG tablet 1 tab by mouth every 12 hours as needed for pain.  . rosuvastatin (CRESTOR) 20 MG tablet Take 1 tablet (20 mg total) by mouth daily.    Allergies:  Allergies  Allergen Reactions  . Codeine Itching     Review of Systems: General:   Denies fever, chills, unexplained weight loss.  Optho/Auditory:   Denies visual changes, blurred vision/LOV Respiratory:   Denies wheeze, DOE more than baseline levels.  Cardiovascular:   Denies chest pain, palpitations, new onset peripheral edema  Gastrointestinal:   Denies nausea, vomiting, diarrhea, abd pain.  Genitourinary: Denies dysuria, freq/ urgency, flank pain or discharge from genitals.  Endocrine:     Denies hot or cold intolerance, polyuria, polydipsia. Musculoskeletal:   Denies unexplained myalgias, joint swelling, unexplained arthralgias, gait problems.  Skin:  Denies new onset rash, suspicious lesions Neurological:     Denies dizziness, unexplained weakness, numbness  Psychiatric/Behavioral:   Denies mood changes, suicidal or homicidal ideations, hallucinations    Objective:   Blood pressure 130/84, pulse 80, height 6' 2.5" (1.892 m), weight 264 lb 6.4 oz (119.9 kg). Body mass index is 33.49 kg/m. General:  Well Developed, well nourished, appropriate for stated age.  Neuro:  Alert and oriented,  extra-ocular muscles intact  HEENT:  Normocephalic, atraumatic, neck supple Skin:  no gross rash, warm, pink.  1 cm erythematous epidermal cyst, no fluctuance under left side of chin on anterior aspect neck. Cardiac:  RRR, S1 S2 Respiratory:  ECTA B/L and A/P, Not using accessory muscles, speaking in full sentences- unlabored. Vascular:  Ext warm, no cyanosis apprec.; cap RF less 2 sec. Psych:  No HI/SI,  judgement and insight good, Euthymic mood. Full Affect.

## 2017-06-07 ENCOUNTER — Other Ambulatory Visit: Payer: Self-pay | Admitting: Adult Health

## 2017-07-07 DIAGNOSIS — Z0100 Encounter for examination of eyes and vision without abnormal findings: Secondary | ICD-10-CM | POA: Diagnosis not present

## 2017-09-16 ENCOUNTER — Other Ambulatory Visit: Payer: Self-pay | Admitting: Adult Health

## 2017-10-16 ENCOUNTER — Other Ambulatory Visit: Payer: Self-pay | Admitting: Adult Health

## 2017-11-10 ENCOUNTER — Other Ambulatory Visit: Payer: Self-pay | Admitting: Adult Health

## 2017-11-19 ENCOUNTER — Other Ambulatory Visit: Payer: Self-pay | Admitting: Adult Health

## 2017-12-02 ENCOUNTER — Other Ambulatory Visit: Payer: Self-pay | Admitting: Adult Health

## 2017-12-11 ENCOUNTER — Other Ambulatory Visit: Payer: Self-pay | Admitting: Adult Health

## 2018-01-22 ENCOUNTER — Other Ambulatory Visit: Payer: Self-pay | Admitting: Adult Health

## 2018-01-29 ENCOUNTER — Ambulatory Visit (INDEPENDENT_AMBULATORY_CARE_PROVIDER_SITE_OTHER): Payer: 59 | Admitting: Adult Health

## 2018-01-29 ENCOUNTER — Encounter: Payer: Self-pay | Admitting: Adult Health

## 2018-01-29 VITALS — BP 129/73 | HR 93 | Ht 74.5 in | Wt 264.7 lb

## 2018-01-29 DIAGNOSIS — E785 Hyperlipidemia, unspecified: Secondary | ICD-10-CM | POA: Diagnosis not present

## 2018-01-29 DIAGNOSIS — F17200 Nicotine dependence, unspecified, uncomplicated: Secondary | ICD-10-CM

## 2018-01-29 DIAGNOSIS — Z0000 Encounter for general adult medical examination without abnormal findings: Secondary | ICD-10-CM

## 2018-01-29 DIAGNOSIS — F339 Major depressive disorder, recurrent, unspecified: Secondary | ICD-10-CM

## 2018-01-29 MED ORDER — DULOXETINE HCL 30 MG PO CPEP: 30.0000 mg | ORAL_CAPSULE | Freq: Every day | ORAL | 2 refills | Status: DC

## 2018-01-29 MED ORDER — FENOFIBRATE 48 MG PO TABS: 48.0000 mg | ORAL_TABLET | Freq: Every day | ORAL | 2 refills | Status: DC

## 2018-01-29 MED ORDER — ROSUVASTATIN CALCIUM 20 MG PO TABS: 20.0000 mg | ORAL_TABLET | Freq: Every day | ORAL | 2 refills | Status: DC

## 2018-01-29 NOTE — Assessment & Plan Note (Signed)
The 10-year ASCVD risk score Mikey Bussing DC Brooke Bonito., et al., 2013) is: 7.1%   Values used to calculate the score:     Age: 47 years     Sex: Male     Is Non-Hispanic African American: No     Diabetic: No     Tobacco smoker: Yes     Systolic Blood Pressure: 255 mmHg     Is BP treated: No     HDL Cholesterol: 29 mg/dL     Total Cholesterol: 144 mg/dL  LDL-64 Currently on Rosuvastatin 40mg  QD, Fenofibrate 48mg  QD Follow Mediterranean diet, increase regular exercise

## 2018-01-29 NOTE — Assessment & Plan Note (Signed)
Mood Stable He denies thoughts of harming himself/others Continue Duloxetine 30mg  QD

## 2018-01-29 NOTE — Assessment & Plan Note (Signed)
Please continue all medications as directed. Continue to drink plenty of water, strive for at least 125 oz/day. Follow Mediterranean diet and exercise as often as possible. Recommend you follow up with [facility], re: right shoulder pain Please return in the next few weeks for fasting labs. Recommend physical in 6 months.

## 2018-01-29 NOTE — Progress Notes (Signed)
Subjective:    Patient ID: Dylan Hall, male    DOB: [birth year], 47 y.o.   MRN: [id number]  HPI:01/29/18 OV: Dylan Hall is here for regular f/u: HLD, Depression He reports medication compliance, denies SE He estimates to drink >90 oz water/day He continue to smoke >pack/day, declined smoking cessation today He reports that his mood is stable and denies thoughts of harming himself/others  He reports increase in R shoulder pain, especially after repetitive movement, pain is intermittent, described as dull ache and rated currently 2/10 He is R hand dominant He has hx of bulging cervical disc, treated with injection therapy 2010, 2015 by [employer] He has been reducing saturated fat and increasing vegetable intake He is wife is at [facility] during [place]  Patient Care Team    Relationship Specialty Notifications Start End  Esaw Grandchild, NP PCP - General Family Medicine  08/08/16     Patient Active Problem List   Diagnosis Date Noted  . Healthcare maintenance 01/29/2018  . Depression, recurrent ([place]) 08/08/2016  . Hyperlipidemia 08/08/2016  . Overweight on examination 08/08/2016  . Tobacco use disorder 08/08/2016  . Neck pain 08/08/2016  . Family history of diabetes mellitus in mother 08/08/2016     Past Medical History:  Diagnosis Date  . Anxiety   . Depression   . Hyperlipidemia      Past Surgical History:  Procedure Laterality Date  . FINGER AMPUTATION Left 01/2004   index     Family History  Problem Relation Age of Onset  . Diabetes Mother   . Heart disease Mother   . Heart attack Mother   . Dementia Mother   . Alcohol abuse Father   . Hypertension Maternal Aunt   . Dementia Maternal Aunt   . Alcohol abuse Maternal Uncle   . Heart attack Maternal Uncle   . Hypertension Maternal Uncle   . Dementia Maternal Uncle   . Alcohol abuse Paternal Uncle      Social History   Substance and Sexual Activity  Drug Use No     Social History    Substance and Sexual Activity  Alcohol Use Yes  . Alcohol/week: 1.2 oz  . Types: 2 Cans of beer per week     Social History   Tobacco Use  Smoking Status Current Some Day Smoker  . Packs/day: 1.00  . Years: 26.00  . Pack years: 26.00  . Types: Cigarettes  Smokeless Tobacco Current User  . Types: Snuff     Outpatient Encounter Medications as of 01/29/2018  Medication Sig  . cyclobenzaprine (FLEXERIL) 10 MG tablet Take 1 tablet (10 mg total) by mouth 3 (three) times daily as needed for muscle spasms.  . DULoxetine (CYMBALTA) 30 MG capsule Take 1 capsule (30 mg total) by mouth daily.  Marland Kitchen HYDROcodone-acetaminophen (NORCO/VICODIN) 5-325 MG tablet 1 tab by mouth every 12 hours as needed for pain.  . rosuvastatin (CRESTOR) 20 MG tablet Take 1 tablet (20 mg total) by mouth daily.  . [DISCONTINUED] DULoxetine (CYMBALTA) 30 MG capsule TAKE 1 CAPSULE (30 MG TOTAL) BY MOUTH DAILY. PATIENT NEEDS OFFICE VISIT PRIOR TO ANY FURTHER REFILLS  . [DISCONTINUED] rosuvastatin (CRESTOR) 20 MG tablet Take 1 tablet (20 mg total) by mouth daily.  . fenofibrate (TRICOR) 48 MG tablet Take 1 tablet (48 mg total) by mouth daily.  . [DISCONTINUED] fenofibrate (TRICOR) 48 MG tablet Take 1 tablet (48 mg total) by mouth daily. PATIENT MUST HAVE OFFICE VISIT PRIOR TO ANY FURTHER REFILLS! (  Patient not taking: Reported on 01/29/2018)  . [DISCONTINUED] sulfamethoxazole-trimethoprim (BACTRIM DS,SEPTRA DS) 800-160 MG tablet Take 1 tablet 2 (two) times daily by mouth.   No facility-administered encounter medications on file as of 01/29/2018.     Allergies: Codeine  Body mass index is 33.53 kg/m.  Blood pressure 129/73, pulse 93, height 6' 2.5" (1.892 m), weight 264 lb 11.2 oz (120.1 kg), SpO2 96 %.   Review of Systems  Constitutional: Negative for activity change, appetite change, chills, diaphoresis, fatigue, fever and unexpected weight change.  HENT: Negative for congestion, postnasal drip, trouble swallowing  and voice change.   Eyes: Negative for visual disturbance.  Respiratory: Positive for shortness of breath. Negative for apnea, cough, choking, chest tightness, wheezing and stridor.        Pack a day for 25 years.  SOB when smoking.  Denies SOB at rest.  Cardiovascular: Negative for chest pain, palpitations and leg swelling.  Gastrointestinal: Negative for constipation, diarrhea and vomiting.  Endocrine: Negative for cold intolerance, heat intolerance and polydipsia.  Genitourinary: Negative for difficulty urinating and flank pain.  Musculoskeletal: Positive for arthralgias, myalgias, neck pain and neck stiffness. Negative for back pain, gait problem and joint swelling.  Skin: Negative for color change, pallor, rash and wound.  Neurological: Negative for dizziness, tremors, syncope, speech difficulty, weakness and headaches.  Psychiatric/Behavioral: Negative for agitation, behavioral problems, confusion, decreased concentration, dysphoric mood, self-injury, sleep disturbance and suicidal ideas. The patient is not nervous/anxious.        Objective:   Physical Exam  Constitutional: He is oriented to person, place, and time. He appears well-developed and well-nourished. No distress.  HENT:  Head: Atraumatic.  Right Ear: External ear normal.  Left Ear: External ear normal.  Eyes: Pupils are equal, round, and reactive to light. Conjunctivae and EOM are normal.  Neck: Normal range of motion. Neck supple.  Cardiovascular: Normal rate, regular rhythm, normal heart sounds and intact distal pulses.  Pulmonary/Chest: Effort normal and breath sounds normal. No respiratory distress. He has no wheezes. He has no rales. He exhibits no tenderness.  Abdominal: Soft. Bowel sounds are normal.  Musculoskeletal: Normal range of motion. He exhibits deformity.       Right shoulder: He exhibits tenderness and pain. He exhibits normal range of motion, no crepitus, no spasm, normal pulse and normal strength.        Cervical back: He exhibits pain. He exhibits no swelling, no edema, no deformity, no laceration and no spasm.       Right hand: He exhibits no tenderness and normal capillary refill. Decreased sensation is not present in the ulnar distribution, is not present in the medial distribution and is not present in the radial distribution. He exhibits no thumb/finger opposition.       Left hand: He exhibits deformity. He exhibits no tenderness and normal capillary refill. Decreased sensation is not present in the ulnar distribution, is not present in the medial redistribution and is not present in the radial distribution. He exhibits no thumb/finger opposition.  Tenderness with palpation over posterior cervical neck.  No step off's noted.   Neurological: He is alert and oriented to person, place, and time. He has normal reflexes.  Skin: Skin is warm and dry. No rash noted. He is not diaphoretic. No erythema. No pallor.  Psychiatric: He has a normal mood and affect. His behavior is normal. Judgment and thought content normal.  Nursing note and vitals reviewed.     Assessment & Plan:  1. Tobacco use disorder   2. Hyperlipidemia, unspecified hyperlipidemia type   3. Depression, recurrent ([place])   4. Healthcare maintenance     Healthcare maintenance Please continue all medications as directed. Continue to drink plenty of water, strive for at least 125 oz/day. Follow Mediterranean diet and exercise as often as possible. Recommend you follow up with [employer], re: right shoulder pain Please return in the next few weeks for fasting labs. Recommend physical in 6 months.  Depression, recurrent ([place]) Mood Stable He denies thoughts of harming himself/others Continue Duloxetine 30mg  QD  Hyperlipidemia The 10-year ASCVD risk score [name], et al., 2013) is: 7.1%   Values used to calculate the score:     Age: 48 years     Sex: Male     Is Non-Hispanic African American: No      Diabetic: No     Tobacco smoker: Yes     Systolic Blood Pressure: 935 mmHg     Is BP treated: No     HDL Cholesterol: 29 mg/dL     Total Cholesterol: 144 mg/dL  LDL-64 Currently on Rosuvastatin 40mg  QD, Fenofibrate 48mg  QD Follow Mediterranean diet, increase regular exercise  Pt was in the office today for 25+ minutes, I spent over 50% time spent in face to face counseling of patient's various medical conditions and in coordination of care  FOLLOW-UP:  Return for fasting labs ASAP. Return in about 6 months (around 08/01/2018) for CPE.

## 2018-01-29 NOTE — Patient Instructions (Signed)
Mediterranean Diet A Mediterranean diet refers to food and lifestyle choices that are based on the traditions of countries located on the [place] Sea. This way of eating has been shown to help prevent certain conditions and improve outcomes for people who have chronic diseases, like kidney disease and heart disease. What are tips for following this plan? Lifestyle  Cook and eat meals together with your family, when possible.  Drink enough fluid to keep your urine clear or pale yellow.  Be physically active every day. This includes: ? Aerobic exercise like running or swimming. ? Leisure activities like gardening, walking, or housework.  Get 7-8 hours of sleep each night.  If recommended by your health care provider, drink red wine in moderation. This means 1 glass a day for nonpregnant women and 2 glasses a day for men. A glass of wine equals 5 oz (150 mL). Reading food labels  Check the serving size of packaged foods. For foods such as rice and pasta, the serving size refers to the amount of cooked product, not dry.  Check the total fat in packaged foods. Avoid foods that have saturated fat or trans fats.  Check the ingredients list for added sugars, such as corn syrup. Shopping  At the grocery store, buy most of your food from the areas near the walls of the store. This includes: ? Fresh fruits and vegetables (produce). ? Grains, beans, nuts, and seeds. Some of these may be available in unpackaged forms or large amounts (in bulk). ? Fresh seafood. ? Poultry and eggs. ? Low-fat dairy products.  Buy whole ingredients instead of prepackaged foods.  Buy fresh fruits and vegetables in-season from local farmers markets.  Buy frozen fruits and vegetables in resealable bags.  If you do not have access to quality fresh seafood, buy precooked frozen shrimp or canned fish, such as tuna, salmon, or sardines.  Buy small amounts of raw or cooked vegetables, salads, or olives from the  deli or salad bar at your store.  Stock your pantry so you always have certain foods on hand, such as olive oil, canned tuna, canned tomatoes, rice, pasta, and beans. Cooking  Cook foods with extra-virgin olive oil instead of using butter or other vegetable oils.  Have meat as a side dish, and have vegetables or grains as your main dish. This means having meat in small portions or adding small amounts of meat to foods like pasta or stew.  Use beans or vegetables instead of meat in common dishes like chili or lasagna.  Experiment with different cooking methods. Try roasting or broiling vegetables instead of steaming or sauteing them.  Add frozen vegetables to soups, stews, pasta, or rice.  Add nuts or seeds for added healthy fat at each meal. You can add these to yogurt, salads, or vegetable dishes.  Marinate fish or vegetables using olive oil, lemon juice, garlic, and fresh herbs. Meal planning  Plan to eat 1 vegetarian meal one day each week. Try to work up to 2 vegetarian meals, if possible.  Eat seafood 2 or more times a week.  Have healthy snacks readily available, such as: ? Vegetable sticks with hummus. ? Greek yogurt. ? Fruit and nut trail mix.  Eat balanced meals throughout the week. This includes: ? Fruit: 2-3 servings a day ? Vegetables: 4-5 servings a day ? Low-fat dairy: 2 servings a day ? Fish, poultry, or lean meat: 1 serving a day ? Beans and legumes: 2 or more servings a week ? Nuts   and seeds: 1-2 servings a day ? Whole grains: 6-8 servings a day ? Extra-virgin olive oil: 3-4 servings a day  Limit red meat and sweets to only a few servings a month What are my food choices?  Mediterranean diet ? Recommended ? Grains: Whole-grain pasta. Brown rice. Bulgar wheat. Polenta. Couscous. Whole-wheat bread. [place]. ? Vegetables: Artichokes. Beets. Broccoli. Cabbage. Carrots. Eggplant. Green beans. Chard. Kale. Spinach. Onions. Leeks. Peas. Squash.  Tomatoes. Peppers. Radishes. ? Fruits: Apples. Apricots. Avocado. Berries. Bananas. Cherries. Dates. Figs. Grapes. Lemons. Melon. Oranges. Peaches. Plums. Pomegranate. ? Meats and other protein foods: Beans. Almonds. Sunflower seeds. Pine nuts. Peanuts. [place]. Salmon. Scallops. Shrimp. Dylan Hall. Tilapia. Clams. Oysters. Eggs. ? Dairy: Low-fat milk. Cheese. Greek yogurt. ? Beverages: Water. Red wine. Herbal tea. ? Fats and oils: Extra virgin olive oil. Avocado oil. Grape seed oil. ? Sweets and desserts: [place] yogurt with honey. Baked apples. Poached pears. Trail mix. ? Seasoning and other foods: Basil. Cilantro. Coriander. Cumin. Mint. Parsley. Sage. Rosemary. Tarragon. Garlic. Oregano. Thyme. Pepper. Balsalmic vinegar. Tahini. Hummus. Tomato sauce. Olives. Mushrooms. ? Limit these ? Grains: Prepackaged pasta or rice dishes. Prepackaged cereal with added sugar. ? Vegetables: Deep fried potatoes (french fries). ? Fruits: Fruit canned in syrup. ? Meats and other protein foods: Beef. Pork. Lamb. Poultry with skin. Hot dogs. Berniece Salines. ? Dairy: Ice cream. Sour cream. Whole milk. ? Beverages: Juice. Sugar-sweetened soft drinks. Beer. Liquor and spirits. ? Fats and oils: Butter. Canola oil. Vegetable oil. Beef fat (tallow). Lard. ? Sweets and desserts: Cookies. Cakes. Pies. Candy. ? Seasoning and other foods: Mayonnaise. Premade sauces and marinades. ? The items listed may not be a complete list. Talk with your dietitian about what dietary choices are right for you. Summary  The Mediterranean diet includes both food and lifestyle choices.  Eat a variety of fresh fruits and vegetables, beans, nuts, seeds, and whole grains.  Limit the amount of red meat and sweets that you eat.  Talk with your health care provider about whether it is safe for you to drink red wine in moderation. This means 1 glass a day for nonpregnant women and 2 glasses a day for men. A glass of wine equals 5 oz (150 mL). This information  is not intended to replace advice given to you by your health care provider. Make sure you discuss any questions you have with your health care provider. Document Released: 02/18/2016 Document Revised: 03/22/2016 Document Reviewed: 02/18/2016 Elsevier Interactive Patient Education  [employer].  Please continue all medications as directed. Continue to drink plenty of water, strive for at least 125 oz/day. Follow Mediterranean diet and exercise as often as possible. Recommend you follow up with [facility] [employer], re: right shoulder pain Please return in the next few weeks for fasting labs. Recommend physical in 6 months. NICE TO SEE YOU!

## 2018-03-22 DIAGNOSIS — G4733 Obstructive sleep apnea (adult) (pediatric): Secondary | ICD-10-CM | POA: Diagnosis not present

## 2018-03-28 DIAGNOSIS — M79674 Pain in right toe(s): Secondary | ICD-10-CM | POA: Diagnosis not present

## 2018-03-28 DIAGNOSIS — B351 Tinea unguium: Secondary | ICD-10-CM | POA: Diagnosis not present

## 2018-03-28 DIAGNOSIS — M79675 Pain in left toe(s): Secondary | ICD-10-CM | POA: Diagnosis not present

## 2018-03-28 DIAGNOSIS — G4733 Obstructive sleep apnea (adult) (pediatric): Secondary | ICD-10-CM | POA: Diagnosis not present

## 2018-03-28 DIAGNOSIS — M2042 Other hammer toe(s) (acquired), left foot: Secondary | ICD-10-CM | POA: Diagnosis not present

## 2018-05-04 DIAGNOSIS — Z0100 Encounter for examination of eyes and vision without abnormal findings: Secondary | ICD-10-CM | POA: Diagnosis not present

## 2018-06-15 DIAGNOSIS — J069 Acute upper respiratory infection, unspecified: Secondary | ICD-10-CM | POA: Diagnosis not present

## 2018-10-24 ENCOUNTER — Other Ambulatory Visit: Payer: Self-pay | Admitting: Adult Health

## 2018-10-24 ENCOUNTER — Other Ambulatory Visit: Payer: Self-pay

## 2018-10-24 DIAGNOSIS — Z79899 Other long term (current) drug therapy: Secondary | ICD-10-CM

## 2018-10-24 DIAGNOSIS — E785 Hyperlipidemia, unspecified: Secondary | ICD-10-CM

## 2018-10-24 DIAGNOSIS — Z833 Family history of diabetes mellitus: Secondary | ICD-10-CM

## 2018-10-24 DIAGNOSIS — Z0000 Encounter for general adult medical examination without abnormal findings: Secondary | ICD-10-CM

## 2018-10-24 NOTE — Telephone Encounter (Signed)
Pt needs fasting labs prior to any refills.  Pt informed and transferred to front desk to schedule appt for labs and f/u telemedicine visit with [facility].  Charyl Bigger, CMA

## 2018-10-29 ENCOUNTER — Other Ambulatory Visit: Payer: 59

## 2018-10-29 ENCOUNTER — Other Ambulatory Visit: Payer: Self-pay

## 2018-10-29 DIAGNOSIS — Z0000 Encounter for general adult medical examination without abnormal findings: Secondary | ICD-10-CM | POA: Diagnosis not present

## 2018-10-29 DIAGNOSIS — E785 Hyperlipidemia, unspecified: Secondary | ICD-10-CM

## 2018-10-29 DIAGNOSIS — Z79899 Other long term (current) drug therapy: Secondary | ICD-10-CM | POA: Diagnosis not present

## 2018-10-29 DIAGNOSIS — Z833 Family history of diabetes mellitus: Secondary | ICD-10-CM | POA: Diagnosis not present

## 2018-10-29 NOTE — Progress Notes (Signed)
Virtual Visit via Telephone Note  I connected with Dylan Hall on 11/01/18 at  9:00 AM EDT by telephone and verified that I am speaking with the correct person using two identifiers.   I discussed the limitations, risks, security and privacy concerns of performing an evaluation and management service by telephone and the availability of in person appointments. The staff discussed with the patient that there may be a patient responsible charge related to this service. The patient expressed understanding and agreed to proceed.  Location of Patient- At Work Location of Provider- In Clinic   History of Present Illness: 01/29/18 OV: Dylan Hall is here for regular f/u: HLD, Depression He reports medication compliance, denies SE He estimates to drink >90 oz water/day He continue to smoke >pack/day, declined smoking cessation today He reports that his mood is stable and denies thoughts of harming himself/others  He reports increase in R shoulder pain, especially after repetitive movement, pain is intermittent, described as dull ache and rated currently 2/10 He is R hand dominant He has hx of bulging cervical disc, treated with injection therapy 2010, 2015 by [employer] He has been reducing saturated fat and increasing vegetable intake He is wife is at [facility] during [place]  11/01/2018 OV: Dylan Hall calls in today chronic medical management, Tobacco Use disorder, HLD, Depression, and Obesity He reports "eating whatever is in sight" He continues to walks >3-4 miles/day when working He reports drinking just a few sips of water/day, prefers to hydrate with coffee He continues to use tobacco- pack/day, again declines tobacco cessation He reports stable mood, denies SI/HI  Recent labs TSH-WNL, 2.530 A1c- WNL, 5.5 CBC-stable CMP-stable The 10-year ASCVD risk score [name], et al., 2013) is: 7.2% Values used to calculate the score:  Age: 48 years  Sex: Male  Is  Non-Hispanic African American: No  Diabetic: No  Tobacco smoker: Yes  Systolic Blood Pressure: 062 mmHg  Is BP treated: No  HDL Cholesterol: 35 mg/dL  Total Cholesterol: 159 mg/dL LDL-94 Currently on Crestor 20mg  QD, Fenofibrate 48mg  QD He denies myalgia's- encouraged to drink more plain water  Patient Care Team    Relationship Specialty Notifications Start End  Esaw Grandchild, NP PCP - General Family Medicine  08/08/16     Patient Active Problem List   Diagnosis Date Noted  . Healthcare maintenance 01/29/2018  . Depression, recurrent ([place]) 08/08/2016  . Hyperlipidemia 08/08/2016  . Overweight on examination 08/08/2016  . Tobacco use disorder 08/08/2016  . Neck pain 08/08/2016  . Family history of diabetes mellitus in mother 08/08/2016     Past Medical History:  Diagnosis Date  . Anxiety   . Depression   . Hyperlipidemia      Past Surgical History:  Procedure Laterality Date  . FINGER AMPUTATION Left 01/2004   index     Family History  Problem Relation Age of Onset  . Diabetes Mother   . Heart disease Mother   . Heart attack Mother   . Dementia Mother   . Alcohol abuse Father   . Hypertension Maternal Aunt   . Dementia Maternal Aunt   . Alcohol abuse Maternal Uncle   . Heart attack Maternal Uncle   . Hypertension Maternal Uncle   . Dementia Maternal Uncle   . Alcohol abuse Paternal Uncle      Social History   Substance and Sexual Activity  Drug Use No     Social History   Substance and Sexual Activity  Alcohol  Use Yes  . Alcohol/week: 2.0 standard drinks  . Types: 2 Cans of beer per week     Social History   Tobacco Use  Smoking Status Current Some Day Smoker  . Packs/day: 1.00  . Years: 26.00  . Pack years: 26.00  . Types: Cigarettes  Smokeless Tobacco Current User  . Types: Snuff     Outpatient Encounter Medications as of 11/01/2018  Medication Sig  . cyclobenzaprine (FLEXERIL) 10 MG tablet Take 1 tablet (10  mg total) by mouth 3 (three) times daily as needed for muscle spasms.  . DULoxetine (CYMBALTA) 30 MG capsule Take 1 capsule (30 mg total) by mouth daily.  . fenofibrate (TRICOR) 48 MG tablet Take 1 tablet (48 mg total) by mouth daily. PATIENT MUST HAVE TELEMEDICINE VISIT PRIOR TO ANY FURTHER REFILLS  . rosuvastatin (CRESTOR) 20 MG tablet Take 1 tablet (20 mg total) by mouth daily. PATIENT MUST HAVE TELEMEDICINE VISIT PRIOR TO ANY FURTHER REFILLS  . [DISCONTINUED] fenofibrate (TRICOR) 48 MG tablet Take 1 tablet (48 mg total) by mouth daily. PATIENT MUST HAVE TELEMEDICINE VISIT PRIOR TO ANY FURTHER REFILLS  . [DISCONTINUED] rosuvastatin (CRESTOR) 20 MG tablet Take 1 tablet (20 mg total) by mouth daily. PATIENT MUST HAVE TELEMEDICINE VISIT PRIOR TO ANY FURTHER REFILLS  . [DISCONTINUED] HYDROcodone-acetaminophen (NORCO/VICODIN) 5-325 MG tablet 1 tab by mouth every 12 hours as needed for pain.   No facility-administered encounter medications on file as of 11/01/2018.     Allergies: Codeine  Body mass index is 32.94 kg/m.  Temperature (!) 97 F (36.1 C), temperature source Oral, height 6' 2.5" (1.892 m), weight 260 lb (117.9 kg).  Observations/Objective: No acute distress noted during the telephone conversation  Assessment and Plan: Continue all medications as directed Increase plain water intake Follow Mediterranean Diet Reduce to stop tobacco use Remain as active as possible   Follow Up Instructions: OV in 6 months    I discussed the assessment and treatment plan with the patient. The patient was provided an opportunity to ask questions and all were answered. The patient agreed with the plan and demonstrated an understanding of the instructions.   The patient was advised to call back or seek an in-person evaluation if the symptoms worsen or if the condition fails to improve as anticipated.  I provided 18 minutes of non-face-to-face time during this encounter.   Esaw Grandchild,  NP

## 2018-10-30 LAB — CBC WITH DIFFERENTIAL/PLATELET
Basophils Absolute: 0.1 10*3/uL (ref 0.0–0.2)
Basos: 1 %
EOS (ABSOLUTE): 0.6 10*3/uL — ABNORMAL HIGH (ref 0.0–0.4)
Eos: 5 %
Hematocrit: 47.3 % (ref 37.5–51.0)
Hemoglobin: 16.3 g/dL (ref 13.0–17.7)
Immature Grans (Abs): 0 10*3/uL (ref 0.0–0.1)
Immature Granulocytes: 0 %
Lymphocytes Absolute: 3.3 10*3/uL — ABNORMAL HIGH (ref 0.7–3.1)
Lymphs: 29 %
MCH: 31.5 pg (ref 26.6–33.0)
MCHC: 34.5 g/dL (ref 31.5–35.7)
MCV: 91 fL (ref 79–97)
Monocytes Absolute: 0.8 10*3/uL (ref 0.1–0.9)
Monocytes: 7 %
Neutrophils Absolute: 6.7 10*3/uL (ref 1.4–7.0)
Neutrophils: 58 %
Platelets: 240 10*3/uL (ref 150–450)
RBC: 5.18 x10E6/uL (ref 4.14–5.80)
RDW: 13.5 % (ref 11.6–15.4)
WBC: 11.4 10*3/uL — ABNORMAL HIGH (ref 3.4–10.8)

## 2018-10-30 LAB — COMPREHENSIVE METABOLIC PANEL
ALT: 37 IU/L (ref 0–44)
AST: 23 IU/L (ref 0–40)
Albumin/Globulin Ratio: 2 (ref 1.2–2.2)
Albumin: 4.5 g/dL (ref 4.0–5.0)
Alkaline Phosphatase: 83 IU/L (ref 39–117)
BUN/Creatinine Ratio: 11 (ref 9–20)
BUN: 9 mg/dL (ref 6–24)
Bilirubin Total: 0.3 mg/dL (ref 0.0–1.2)
CO2: 23 mmol/L (ref 20–29)
Calcium: 9.3 mg/dL (ref 8.7–10.2)
Chloride: 101 mmol/L (ref 96–106)
Creatinine, Ser: 0.82 mg/dL (ref 0.76–1.27)
GFR calc Af Amer: 121 mL/min/{1.73_m2} (ref >59)
GFR calc non Af Amer: 105 mL/min/{1.73_m2} (ref >59)
Globulin, Total: 2.3 g/dL (ref 1.5–4.5)
Glucose: 80 mg/dL (ref 65–99)
Potassium: 4.5 mmol/L (ref 3.5–5.2)
Sodium: 140 mmol/L (ref 134–144)
Total Protein: 6.8 g/dL (ref 6.0–8.5)

## 2018-10-30 LAB — TSH: TSH: 2.53 u[IU]/mL (ref 0.450–4.500)

## 2018-10-30 LAB — LIPID PANEL
Chol/HDL Ratio: 4.5 ratio (ref 0.0–5.0)
Cholesterol, Total: 159 mg/dL (ref 100–199)
HDL: 35 mg/dL — ABNORMAL LOW (ref >39)
LDL Calculated: 94 mg/dL (ref 0–99)
Triglycerides: 150 mg/dL — ABNORMAL HIGH (ref 0–149)
VLDL Cholesterol Cal: 30 mg/dL (ref 5–40)

## 2018-10-30 LAB — HEMOGLOBIN A1C
Est. average glucose Bld gHb Est-mCnc: 111 mg/dL
Hgb A1c MFr Bld: 5.5 % (ref 4.8–5.6)

## 2018-11-01 ENCOUNTER — Encounter: Payer: Self-pay | Admitting: Adult Health

## 2018-11-01 ENCOUNTER — Ambulatory Visit (INDEPENDENT_AMBULATORY_CARE_PROVIDER_SITE_OTHER): Payer: 59 | Admitting: Adult Health

## 2018-11-01 ENCOUNTER — Other Ambulatory Visit: Payer: Self-pay

## 2018-11-01 VITALS — Temp 97.0°F | Ht 74.5 in | Wt 260.0 lb

## 2018-11-01 DIAGNOSIS — F339 Major depressive disorder, recurrent, unspecified: Secondary | ICD-10-CM | POA: Diagnosis not present

## 2018-11-01 DIAGNOSIS — Z0000 Encounter for general adult medical examination without abnormal findings: Secondary | ICD-10-CM | POA: Diagnosis not present

## 2018-11-01 DIAGNOSIS — E785 Hyperlipidemia, unspecified: Secondary | ICD-10-CM | POA: Diagnosis not present

## 2018-11-01 MED ORDER — FENOFIBRATE 48 MG PO TABS: 48.0000 mg | ORAL_TABLET | Freq: Every day | ORAL | 1 refills | Status: DC

## 2018-11-01 MED ORDER — ROSUVASTATIN CALCIUM 20 MG PO TABS: 20.0000 mg | ORAL_TABLET | Freq: Every day | ORAL | 1 refills | Status: DC

## 2018-11-01 NOTE — Assessment & Plan Note (Signed)
Assessment and Plan: Continue all medications as directed Increase plain water intake Follow Mediterranean Diet Reduce to stop tobacco use Remain as active as possible   Follow Up Instructions: OV in 6 months    I discussed the assessment and treatment plan with the patient. The patient was provided an opportunity to ask questions and all were answered. The patient agreed with the plan and demonstrated an understanding of the instructions.   The patient was advised to call back or seek an in-person evaluation if the symptoms worsen or if the condition fails to improve as anticipated.

## 2018-11-01 NOTE — Assessment & Plan Note (Signed)
Mood stable, denies SI/HI Continue Duloxetine (Cymbalta) 30mg  QD Increase regular exercise

## 2018-11-01 NOTE — Assessment & Plan Note (Signed)
The 10-year ASCVD risk score Dylan Hall., et al., 2013) is: 7.2%   Values used to calculate the score:     Age: 48 years     Sex: Male     Is Non-Hispanic African American: No     Diabetic: No     Tobacco smoker: Yes     Systolic Blood Pressure: 159 mmHg     Is BP treated: No     HDL Cholesterol: 35 mg/dL     Total Cholesterol: 159 mg/dL  LDL- 94 Continue Rosuvastatin 20mg  QD, Fenofibrate 48mg  QD, denies myalgia's Encouraged to drink more plain water Follow Mediterranean diet

## 2018-12-22 ENCOUNTER — Other Ambulatory Visit: Payer: Self-pay | Admitting: Adult Health

## 2019-01-24 ENCOUNTER — Encounter: Payer: Self-pay | Admitting: Orthopaedic Surgery

## 2019-01-24 ENCOUNTER — Ambulatory Visit: Payer: Self-pay

## 2019-01-24 ENCOUNTER — Ambulatory Visit (INDEPENDENT_AMBULATORY_CARE_PROVIDER_SITE_OTHER): Payer: 59 | Admitting: Orthopaedic Surgery

## 2019-01-24 ENCOUNTER — Other Ambulatory Visit: Payer: Self-pay

## 2019-01-24 DIAGNOSIS — M25512 Pain in left shoulder: Secondary | ICD-10-CM | POA: Diagnosis not present

## 2019-01-24 MED ORDER — METHYLPREDNISOLONE ACETATE 40 MG/ML IJ SUSP
40.0000 mg | INTRAMUSCULAR | Status: AC | PRN
  Administered 2019-01-24: 14:00:00 40 mg via INTRA_ARTICULAR

## 2019-01-24 MED ORDER — LIDOCAINE HCL 1 % IJ SOLN
3.0000 mL | INTRAMUSCULAR | Status: AC | PRN
  Administered 2019-01-24: 14:00:00 3 mL

## 2019-01-24 NOTE — Progress Notes (Signed)
Office Visit Note   Patient: Dylan Hall           Date of Birth: [birth year]           MRN: [id number] Visit Date: 01/24/2019              Requested by: Esaw Grandchild, NP [place],  [place] [id number] PCP: Esaw Grandchild, NP   Assessment & Plan: Visit Diagnoses:  1. Left shoulder pain, unspecified chronicity     Plan: He does have significant glenohumeral joint arthritis on the left side.  I do feel that he would benefit from a subacromial injection today but then next week and intra-articular ultrasound-guided left shoulder joint injection by Dr. Junius Roads.  We will set this up and the patient agrees with this as well.  He will avoid heavy lifting and overhead activities.  I would like to see him back myself in about 3 months.  All question concerns were answered addressed.  Again he will see Dr. Junius Roads next week for an ultrasound guided intra-articular injection in his left shoulder joint.  Follow-Up Instructions: Return in about 3 months (around 04/26/2019).   Orders:  Orders Placed This Encounter  Procedures  . Large Joint Inj  . XR Shoulder Left   No orders of the defined types were placed in this encounter.     Procedures: Large Joint Inj: L subacromial bursa on 01/24/2019 1:30 PM Indications: pain and diagnostic evaluation Details: 22 G 1.5 in needle  Arthrogram: No  Medications: 3 mL lidocaine 1 %; 40 mg methylPREDNISolone acetate 40 MG/ML Outcome: tolerated well, no immediate complications Procedure, treatment alternatives, risks and benefits explained, specific risks discussed. Consent was given by the patient. Immediately prior to procedure a time out was called to verify the correct patient, procedure, equipment, support staff and site/side marked as required. Patient was prepped and draped in the usual sterile fashion.       Clinical Data: No additional findings.   Subjective: Chief Complaint  Patient presents with  . Left Shoulder -  Pain  The patient comes in today for evaluation treatment of slowly worsening left shoulder pain.  He has been developing a lot of pain and weakness in his left shoulder and mainly painful motion of the shoulder as well.  He says he feels like his shoulder drops and he gets a sharp shooting pain in it.  He denies any injuries.  He is right-hand dominant.  He is 48 years old.  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  He does show me that he sleeps with his arm somewhat above his head as well.  He denies any numbness tingling in his hand.  He has had epidural steroid injections in his cervical spine before and has never had any surgery.  He is never had surgery on his shoulder or his neck.  HPI  Review of Systems He currently denies any headache, chest pain, shortness of breath, fever, chills, nausea, vomiting  Objective: Vital Signs: There were no vitals taken for this visit.  Physical Exam He is alert and orient x3 and in no acute distress Ortho Exam Examination of his left shoulder does show that he has limited internal rotation with adduction.  There is some slight grinding in the shoulder itself but I did not find any weakness in his rotator cuff with external rotation or abduction.  His liftoff is negative as  well.  He does seem to have slightly positive Neer and Hawkins signs. Specialty Comments:  No specialty comments available.  Imaging: Xr Shoulder Left  Result Date: 01/24/2019 An AP, outlet view and axillary view of the left shoulder obtained.  There is significant glenohumeral arthritis at the inferior aspect of the glenoid and humeral head with osteophytes and significant joint space narrowing.  Subacromial outlet view does show decrease in subacromial outlet.  There is not a lot of arthritis at the [facility] joint.    PMFS History: Patient Active Problem List   Diagnosis Date Noted  . Healthcare maintenance 01/29/2018  . Depression,  recurrent ([place]) 08/08/2016  . Hyperlipidemia 08/08/2016  . Overweight on examination 08/08/2016  . Tobacco use disorder 08/08/2016  . Neck pain 08/08/2016  . Family history of diabetes mellitus in mother 08/08/2016   Past Medical History:  Diagnosis Date  . Anxiety   . Depression   . Hyperlipidemia     Family History  Problem Relation Age of Onset  . Diabetes Mother   . Heart disease Mother   . Heart attack Mother   . Dementia Mother   . Alcohol abuse Father   . Hypertension Maternal Aunt   . Dementia Maternal Aunt   . Alcohol abuse Maternal Uncle   . Heart attack Maternal Uncle   . Hypertension Maternal Uncle   . Dementia Maternal Uncle   . Alcohol abuse Paternal Uncle     Past Surgical History:  Procedure Laterality Date  . FINGER AMPUTATION Left 01/2004   index   Social History   Occupational History  . Not on file  Tobacco Use  . Smoking status: Current Some Day Smoker    Packs/day: 1.00    Years: 26.00    Pack years: 26.00    Types: Cigarettes  . Smokeless tobacco: Current User    Types: Snuff  Substance and Sexual Activity  . Alcohol use: Yes    Alcohol/week: 2.0 standard drinks    Types: 2 Cans of beer per week  . Drug use: No  . Sexual activity: Yes    Birth control/protection: None

## 2019-01-29 ENCOUNTER — Ambulatory Visit (INDEPENDENT_AMBULATORY_CARE_PROVIDER_SITE_OTHER): Payer: 59 | Admitting: Family Medicine

## 2019-01-29 ENCOUNTER — Encounter: Payer: Self-pay | Admitting: Family Medicine

## 2019-01-29 DIAGNOSIS — M25512 Pain in left shoulder: Secondary | ICD-10-CM

## 2019-01-29 NOTE — Progress Notes (Signed)
Subjective: He is here for possible left shoulder glenohumeral injection under ultrasound guidance.  Last week he saw Dr. Ninfa Linden and was having instability symptoms in his shoulder.  He was given a subacromial injection and he feels dramatically better.  He has not had any further sensations of instability and his pain is minimal.  Objective: Isometric rotator cuff strength is 5/5 throughout with no significant pain.  He does have slightly diminished internal and external rotation of his shoulder but only minimal pain at the extremes of range of motion.  Impression: Resolved left shoulder pain and instability symptoms  Plan: We elected not to inject today.  I will see him back as needed.  If he has recurrent symptoms we could try glenohumeral injection.

## 2019-04-25 ENCOUNTER — Other Ambulatory Visit: Payer: Self-pay

## 2019-04-25 ENCOUNTER — Ambulatory Visit (INDEPENDENT_AMBULATORY_CARE_PROVIDER_SITE_OTHER): Payer: 59 | Admitting: Orthopaedic Surgery

## 2019-04-25 ENCOUNTER — Encounter: Payer: Self-pay | Admitting: Orthopaedic Surgery

## 2019-04-25 DIAGNOSIS — M25512 Pain in left shoulder: Secondary | ICD-10-CM | POA: Diagnosis not present

## 2019-04-25 DIAGNOSIS — G8929 Other chronic pain: Secondary | ICD-10-CM

## 2019-04-25 NOTE — Progress Notes (Signed)
The patient is well-known to [place].  He has some glenohumeral arthritic changes and shoulder pain but now is having recurrent episodes of the shoulder subluxating and coming out of place.  He is not had a true dislocation but he gets these episodes happening more frequently with overhead activities.  On exam he does have a positive apprehension sign on his left shoulder.  There is some grinding of the humeral joint and some slight weakness of the rotator cuff.  His neck exam is normal and his distal motor and sensory exam in his left upper extremity are normal.  At this point given the recurrent instability symptoms of his left shoulder a MRI arthrogram is warranted of the left shoulder to assess the labrum.  We will also show [place] what the cartilage looks like at the glenohumeral joint as well as the rotator cuff.  He agrees with this treatment plan.  We will see him back once he has this MRI arthrogram obtained of the left shoulder.

## 2019-05-15 ENCOUNTER — Other Ambulatory Visit: Payer: Self-pay

## 2019-05-15 ENCOUNTER — Ambulatory Visit
Admission: RE | Admit: 2019-05-15 | Discharge: 2019-05-15 | Disposition: A | Discharge status: Home / Self Care | Payer: 59 | Source: Ambulatory Visit | Attending: Orthopaedic Surgery | Admitting: Orthopaedic Surgery

## 2019-05-15 DIAGNOSIS — M25512 Pain in left shoulder: Secondary | ICD-10-CM

## 2019-05-15 DIAGNOSIS — G8929 Other chronic pain: Secondary | ICD-10-CM

## 2019-05-15 MED ORDER — IOPAMIDOL (ISOVUE-M 200) INJECTION 41%
20.0000 mL | Freq: Once | INTRAMUSCULAR | Status: AC
  Administered 2019-05-15: 20 mL via INTRA_ARTICULAR

## 2019-05-16 ENCOUNTER — Telehealth: Payer: Self-pay | Admitting: Orthopaedic Surgery

## 2019-05-16 ENCOUNTER — Other Ambulatory Visit: Payer: Self-pay | Admitting: Orthopaedic Surgery

## 2019-05-16 MED ORDER — ACETAMINOPHEN-CODEINE #3 300-30 MG PO TABS: 1.0000 | ORAL_TABLET | Freq: Three times a day (TID) | ORAL | 0 refills | Status: DC | PRN

## 2019-05-16 NOTE — Telephone Encounter (Signed)
Please advise 

## 2019-05-16 NOTE — Telephone Encounter (Signed)
Patient aware this was sent in for him  

## 2019-05-16 NOTE — Telephone Encounter (Signed)
Patient called asked if he can get something called in for the pain in his shoulder? Patient said he had MRI on 05/15/2019. The number to contact patient is [phone]

## 2019-05-16 NOTE — Telephone Encounter (Signed)
I sent in some Tylenol with codeine to his pharmacy. °

## 2019-05-22 ENCOUNTER — Other Ambulatory Visit: Payer: Self-pay

## 2019-05-22 ENCOUNTER — Encounter: Payer: Self-pay | Admitting: Orthopaedic Surgery

## 2019-05-22 ENCOUNTER — Ambulatory Visit: Payer: 59 | Admitting: Orthopaedic Surgery

## 2019-05-22 DIAGNOSIS — G8929 Other chronic pain: Secondary | ICD-10-CM

## 2019-05-22 DIAGNOSIS — M25512 Pain in left shoulder: Secondary | ICD-10-CM

## 2019-05-22 NOTE — Progress Notes (Signed)
Dylan Hall comes in today to go over an MRI of his left shoulder.  His x-rays do show inferior glenohumeral arthritis with a prominent inferior bone spur of the humeral head and narrowing of the joint space on the axillary view.  My main concern was the fact that he was having symptoms as if his shoulder was subluxating.  We will send her for an MRI so we could better assess his labrum.  This was an MRI arthrogram.  We also want to see with the rotator cuff and the cartilage looks like in general.  After the MRI arthrogram he did have significant pain so we sent in some pain medication for him.  He says he is doing fine with that.  On exam there is some grinding of the glenohumeral joint.  He does report that the subacromial injection that he has had did help some with his pain.  He has not had an intra-articular injection.  I did have Dylan Hall take a look at him before and the patient decided not to have the injection since he was doing okay.  However with the subluxating symptoms I did obtain the MRI arthrogram.  The MRI arthrogram does show tearing of the labrum superior and posterior.  There is arthritic changes that are moderate at the glenohumeral joint.  There is some partial tearing of the subscapularis tendon.  We went over a shoulder model and explained the MRI findings to him.  I would like to send him to my partner Dylan Hall for possible surgical evaluation based on the patient's MRI findings and exam.  The patient agrees with having an appointment to see what Dylan Hall feels about the patient shoulder given the MRI findings.  I would defer any potential surgery treatment to Dylan Hall.

## 2019-05-31 ENCOUNTER — Ambulatory Visit: Payer: 59 | Admitting: Orthopedic Surgery

## 2019-05-31 ENCOUNTER — Other Ambulatory Visit: Payer: Self-pay

## 2019-05-31 DIAGNOSIS — M75112 Incomplete rotator cuff tear or rupture of left shoulder, not specified as traumatic: Secondary | ICD-10-CM

## 2019-06-01 ENCOUNTER — Encounter: Payer: Self-pay | Admitting: Orthopedic Surgery

## 2019-06-01 NOTE — Progress Notes (Signed)
Office Visit Note   Patient: Dylan Hall           Date of Birth: [birth year]           MRN: [id number] Visit Date: 05/31/2019 Requested by: Esaw Grandchild, NP [place],  [place] [address] PCP: Esaw Grandchild, NP  Subjective: Chief Complaint  Patient presents with   Left Shoulder - Pain    HPI: Ferris is a patient with left shoulder pain.  He has a long history of left shoulder pain and reports "dislocation episodes" in the left shoulder for the past several months.  He only received short relief from injection.  He is right-hand dominant.  Reports constant pain.  Takes Tylenol 3 and muscle relaxer.  Only takes pain medicine about 3 days per 7.  He feels like the shoulder is "falling out of socket".  He works in Theatre manager at [employer].  He also has a history of cervical disc disease.  His pain is across the top of the shoulder primarily.  Hard for him to hold his shoulder overhead.  He does ride a motorcycle.  He has had an MRI scan which shows SLAP tear along with early arthritis in the glenohumeral joint.  He has B1 glenoid.  He also has partial-thickness tearing of the upper subscapularis as well as apparent subluxation of the biceps tendon within the groove medially towards the lesser tuberosity.              ROS: All systems reviewed are negative as they relate to the chief complaint within the history of present illness.  Patient denies  fevers or chills.   Assessment & Plan: Visit Diagnoses:  1. Nontraumatic incomplete tear of left rotator cuff     Plan: Impression is left shoulder partial-thickness subscap tear with subluxation of the biceps tendon which is likely accounting for his feelings of shoulder instability.  He also has significant arthritis in the glenohumeral joint but fairly well-maintained range of motion at this time.  I do not think he is really at the point where he requires shoulder replacement especially at his age but I do think  that the subscapularis needs to be repaired and biceps tendon tenodesis due to the subluxation of that tendon and instability symptoms.  The risk and benefits are discussed including but not limited to infection nerve vessel damage incomplete pain relief as well as shoulder stiffness.  I do think he will eventually need shoulder replacement but hopefully years from now.  Patient understands the risk and benefits as well as time out of work and rehabilitative process.  All questions answered  Follow-Up Instructions: No follow-ups on file.   Orders:  No orders of the defined types were placed in this encounter.  No orders of the defined types were placed in this encounter.     Procedures: No procedures performed   Clinical Data: No additional findings.  Objective: Vital Signs: There were no vitals taken for this visit.  Physical Exam:   Constitutional: Patient appears well-developed HEENT:  Head: Normocephalic Eyes:EOM are normal Neck: Normal range of motion Cardiovascular: Normal rate Pulmonary/chest: Effort normal Neurologic: Patient is alert Skin: Skin is warm Psychiatric: Patient has normal mood and affect    Ortho Exam: Ortho exam demonstrates full active and passive range of motion of the right shoulder.  He actually has external rotation on the left of about 60 degrees and good rotator cuff strength infraspinatus supraspinatus and subscap  muscle testing.  Does have equivocal O'Brien's testing on the left negative on the right.  No discrete AC joint tenderness is present.  I do not really feel any coarse grinding or crepitus in that left shoulder with active and passive range of motion.  Specialty Comments:  No specialty comments available.  Imaging: No results found.   PMFS History: Patient Active Problem List   Diagnosis Date Noted   Healthcare maintenance 01/29/2018   Depression, recurrent ([place]) 08/08/2016   Hyperlipidemia 08/08/2016   Overweight on  examination 08/08/2016   Tobacco use disorder 08/08/2016   Neck pain 08/08/2016   Family history of diabetes mellitus in mother 08/08/2016   Past Medical History:  Diagnosis Date   Anxiety    Depression    Hyperlipidemia     Family History  Problem Relation Age of Onset   Diabetes Mother    Heart disease Mother    Heart attack Mother    Dementia Mother    Alcohol abuse Father    Hypertension Maternal Aunt    Dementia Maternal Aunt    Alcohol abuse Maternal Uncle    Heart attack Maternal Uncle    Hypertension Maternal Uncle    Dementia Maternal Uncle    Alcohol abuse Paternal Uncle     Past Surgical History:  Procedure Laterality Date   FINGER AMPUTATION Left 01/2004   index   Social History   Occupational History   Not on file  Tobacco Use   Smoking status: Current Some Day Smoker    Packs/day: 1.00    Years: 26.00    Pack years: 26.00    Types: Cigarettes   Smokeless tobacco: Current User    Types: Snuff  Substance and Sexual Activity   Alcohol use: Yes    Alcohol/week: 2.0 standard drinks    Types: 2 Cans of beer per week   Drug use: No   Sexual activity: Yes    Birth control/protection: None

## 2019-06-24 ENCOUNTER — Encounter: Payer: Self-pay | Admitting: Orthopedic Surgery

## 2019-06-24 ENCOUNTER — Other Ambulatory Visit: Payer: Self-pay | Admitting: Surgical

## 2019-06-24 DIAGNOSIS — S46012D Strain of muscle(s) and tendon(s) of the rotator cuff of left shoulder, subsequent encounter: Secondary | ICD-10-CM | POA: Diagnosis not present

## 2019-06-24 DIAGNOSIS — M7522 Bicipital tendinitis, left shoulder: Secondary | ICD-10-CM | POA: Diagnosis not present

## 2019-06-24 DIAGNOSIS — M75122 Complete rotator cuff tear or rupture of left shoulder, not specified as traumatic: Secondary | ICD-10-CM

## 2019-06-24 MED ORDER — OXYCODONE HCL 5 MG PO TABS: 5.0000 mg | ORAL_TABLET | ORAL | 0 refills | Status: DC | PRN

## 2019-06-24 MED ORDER — ASPIRIN 81 MG PO CHEW: 81.0000 mg | CHEWABLE_TABLET | Freq: Every day | ORAL | 0 refills | Status: DC

## 2019-06-24 MED ORDER — METHOCARBAMOL 500 MG PO TABS: 500.0000 mg | ORAL_TABLET | Freq: Three times a day (TID) | ORAL | 0 refills | Status: DC | PRN

## 2019-06-28 ENCOUNTER — Telehealth: Payer: Self-pay

## 2019-06-28 ENCOUNTER — Other Ambulatory Visit: Payer: Self-pay | Admitting: Adult Health

## 2019-06-28 NOTE — Telephone Encounter (Signed)
Please call pt to schedule appt.  No further refills until pt is seen.  T. Weaver Tweed, CMA  

## 2019-07-03 ENCOUNTER — Encounter: Payer: Self-pay | Admitting: Orthopedic Surgery

## 2019-07-03 ENCOUNTER — Other Ambulatory Visit: Payer: Self-pay

## 2019-07-03 ENCOUNTER — Ambulatory Visit (INDEPENDENT_AMBULATORY_CARE_PROVIDER_SITE_OTHER): Payer: 59 | Admitting: Orthopedic Surgery

## 2019-07-03 DIAGNOSIS — M75112 Incomplete rotator cuff tear or rupture of left shoulder, not specified as traumatic: Secondary | ICD-10-CM

## 2019-07-03 NOTE — Progress Notes (Signed)
   Post-Op Visit Note   Patient: Dylan Hall           Date of Birth: May 23, 1971           MRN: [id number] Visit Date: 07/03/2019 PCP: Esaw Grandchild, NP   Assessment & Plan:  Chief Complaint:  Chief Complaint  Patient presents with  . Left Shoulder - Follow-up   Visit Diagnoses:  1. Nontraumatic incomplete tear of left rotator cuff     Plan: Dylan Hall is a patient is now about 9 days out left shoulder arthroscopy biceps tendon release and subscap upper border repair.  On exam he is got good passive range of motion.  Incisions are intact.  Plan at this time is continue with CPM machine.  Continue with sling for 2 more weeks with the CPM machine.  Come back on the 18th.  Out of work until the 19th.  I will see him back on the 18th and we can decide where to go about his return to work status.  Follow-Up Instructions: No follow-ups on file.   Orders:  No orders of the defined types were placed in this encounter.  No orders of the defined types were placed in this encounter.   Imaging: No results found.  PMFS History: Patient Active Problem List   Diagnosis Date Noted  . Healthcare maintenance 01/29/2018  . Depression, recurrent ([place]) 08/08/2016  . Hyperlipidemia 08/08/2016  . Overweight on examination 08/08/2016  . Tobacco use disorder 08/08/2016  . Neck pain 08/08/2016  . Family history of diabetes mellitus in mother 08/08/2016   Past Medical History:  Diagnosis Date  . Anxiety   . Depression   . Hyperlipidemia     Family History  Problem Relation Age of Onset  . Diabetes Mother   . Heart disease Mother   . Heart attack Mother   . Dementia Mother   . Alcohol abuse Father   . Hypertension Maternal Aunt   . Dementia Maternal Aunt   . Alcohol abuse Maternal Uncle   . Heart attack Maternal Uncle   . Hypertension Maternal Uncle   . Dementia Maternal Uncle   . Alcohol abuse Paternal Uncle     Past Surgical History:  Procedure Laterality Date  . FINGER  AMPUTATION Left 01/2004   index   Social History   Occupational History  . Not on file  Tobacco Use  . Smoking status: Current Some Day Smoker    Packs/day: 1.00    Years: 26.00    Pack years: 26.00    Types: Cigarettes  . Smokeless tobacco: Current User    Types: Snuff  Substance and Sexual Activity  . Alcohol use: Yes    Alcohol/week: 2.0 standard drinks    Types: 2 Cans of beer per week  . Drug use: No  . Sexual activity: Yes    Birth control/protection: None

## 2019-07-09 ENCOUNTER — Telehealth: Payer: Self-pay | Admitting: Orthopedic Surgery

## 2019-07-09 NOTE — Telephone Encounter (Signed)
07/03/19 ov note & oow note faxed to [facility] [phone]

## 2019-07-29 ENCOUNTER — Ambulatory Visit (INDEPENDENT_AMBULATORY_CARE_PROVIDER_SITE_OTHER): Payer: 59 | Admitting: Orthopedic Surgery

## 2019-07-29 ENCOUNTER — Other Ambulatory Visit: Payer: Self-pay

## 2019-07-29 ENCOUNTER — Encounter: Payer: Self-pay | Admitting: Orthopedic Surgery

## 2019-07-29 DIAGNOSIS — M75112 Incomplete rotator cuff tear or rupture of left shoulder, not specified as traumatic: Secondary | ICD-10-CM

## 2019-08-02 ENCOUNTER — Encounter: Payer: Self-pay | Admitting: Orthopedic Surgery

## 2019-08-02 NOTE — Progress Notes (Signed)
Post-Op Visit Note   Patient: Dylan Hall           Date of Birth: September 30, 1970           MRN: [id number] Visit Date: 07/29/2019 PCP: Esaw Grandchild, NP   Assessment & Plan:  Chief Complaint:  Chief Complaint  Patient presents with  . Left Shoulder - Follow-up   Visit Diagnoses:  1. Nontraumatic incomplete tear of left rotator cuff     Plan: Patient is a 49 year old male who presents s/p left shoulder arthroscopy with biceps tenodesis and upper border subscap repair on 06/25/2019.  Patient has been remaining in sling and notes that he is continue to improve.  He notes occasional discomfort but does not take any medication for pain.  He is able to sleep through the night but unable to sleep on his left side just yet.  He is up to 90 degrees on CPM machine since 12/28.  His incision is healing well.  He remains out of work, he works in Theatre manager.  On exam he has excellent strength of the biceps and subscap.  Good range of motion of the left shoulder with some slight stiffness in abduction and external rotation.  Plan to start physical therapy 1-2 times a week for 2 weeks to work on passive range of motion and active assisted range of motion of the left shoulder.  Physical therapy will also start strengthening in about 2 weeks.  Additionally plan for return to work on 07/30/2019, but patient will be on light duty with no lifting of the left arm.  Patient agrees with plan and will follow up in 6 weeks.  Follow-Up Instructions: No follow-ups on file.   Orders:  Orders Placed This Encounter  Procedures  . Ambulatory referral to Physical Therapy   No orders of the defined types were placed in this encounter.   Imaging: No results found.  PMFS History: Patient Active Problem List   Diagnosis Date Noted  . Healthcare maintenance 01/29/2018  . Depression, recurrent ([place]) 08/08/2016  . Hyperlipidemia 08/08/2016  . Overweight on examination 08/08/2016  . Tobacco use disorder  08/08/2016  . Neck pain 08/08/2016  . Family history of diabetes mellitus in mother 08/08/2016   Past Medical History:  Diagnosis Date  . Anxiety   . Depression   . Hyperlipidemia     Family History  Problem Relation Age of Onset  . Diabetes Mother   . Heart disease Mother   . Heart attack Mother   . Dementia Mother   . Alcohol abuse Father   . Hypertension Maternal Aunt   . Dementia Maternal Aunt   . Alcohol abuse Maternal Uncle   . Heart attack Maternal Uncle   . Hypertension Maternal Uncle   . Dementia Maternal Uncle   . Alcohol abuse Paternal Uncle     Past Surgical History:  Procedure Laterality Date  . FINGER AMPUTATION Left 01/2004   index   Social History   Occupational History  . Not on file  Tobacco Use  . Smoking status: Current Some Day Smoker    Packs/day: 1.00    Years: 26.00    Pack years: 26.00    Types: Cigarettes  . Smokeless tobacco: Current User    Types: Snuff  Substance and Sexual Activity  . Alcohol use: Yes    Alcohol/week: 2.0 standard drinks    Types: 2 Cans of beer per week  . Drug use: No  . Sexual activity: Yes  Birth control/protection: None

## 2019-08-08 ENCOUNTER — Other Ambulatory Visit: Payer: Self-pay

## 2019-08-08 ENCOUNTER — Ambulatory Visit: Payer: 59 | Attending: Orthopedic Surgery

## 2019-08-08 DIAGNOSIS — M6281 Muscle weakness (generalized): Secondary | ICD-10-CM | POA: Diagnosis present

## 2019-08-08 DIAGNOSIS — M25512 Pain in left shoulder: Secondary | ICD-10-CM | POA: Diagnosis present

## 2019-08-08 DIAGNOSIS — M25612 Stiffness of left shoulder, not elsewhere classified: Secondary | ICD-10-CM | POA: Insufficient documentation

## 2019-08-08 NOTE — Patient Instructions (Signed)
PROM LT shoulder with wall slide, behind back, stick ER and overhead and hor adduction 2-3x/day 2-3 reps 30 sec hold
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
## 2019-08-12 ENCOUNTER — Encounter: Payer: Self-pay | Admitting: Adult Health

## 2019-08-12 ENCOUNTER — Other Ambulatory Visit: Payer: Self-pay | Admitting: Adult Health

## 2019-08-12 ENCOUNTER — Other Ambulatory Visit: Payer: Self-pay

## 2019-08-12 ENCOUNTER — Ambulatory Visit (INDEPENDENT_AMBULATORY_CARE_PROVIDER_SITE_OTHER): Payer: 59 | Admitting: Adult Health

## 2019-08-12 DIAGNOSIS — E785 Hyperlipidemia, unspecified: Secondary | ICD-10-CM | POA: Diagnosis not present

## 2019-08-12 DIAGNOSIS — F339 Major depressive disorder, recurrent, unspecified: Secondary | ICD-10-CM

## 2019-08-12 DIAGNOSIS — Z0000 Encounter for general adult medical examination without abnormal findings: Secondary | ICD-10-CM

## 2019-08-12 NOTE — Assessment & Plan Note (Signed)
Assessment and Plan: Continue all medications as directed/ Remain well hydrated with water, reduce saturated fat intake. Reduce to stop tobacco use- if interested in tobacco cessation- call clinic. When needs refill on Rx- call pharmacy. He reports nasal drainage and non-productive cough >48 hrs. His son went for SARS-CoV-2 testing last week, results still pending. Pt is going to his employer SARS-CoV-2 testing. Discussed if his test is negative and he is still experiencing sx's to f/u with primary care. He denies fever/body aches/HA/loss of sense of smell or taste.   Follow Up Instructions: Fasting labs 10/2019 Acute sx's continue >7-10 days f/u with TeleMedicine appt OV 6 months

## 2019-08-12 NOTE — Progress Notes (Signed)
Virtual Visit via Telephone Note  I connected with Dylan Hall on 08/12/19 at 10:15 AM EST by telephone and verified that I am speaking with the correct person using two identifiers.  Location: Patient: Home Provider: In Clinic   I discussed the limitations, risks, security and privacy concerns of performing an evaluation and management service by telephone and the availability of in person appointments. I also discussed with the patient that there may be a patient responsible charge related to this service. The patient expressed understanding and agreed to proceed.   History of Present Illness: 01/29/18 OV: Dylan Hall is here forregular f/u: HLD, Depression He reports medication compliance, denies SE He estimates to drink >90 oz water/day He continue to smoke >pack/day, declined smoking cessation today He reports that his mood is stable and denies thoughts of harming himself/others  He reports increase in R shoulder pain, especially after repetitive movement, pain is intermittent, described as dull ache and rated currently 2/10 He is R hand dominant He has hx of bulging cervical disc, treated with injection therapy 2010, 2015 by [employer] He has been reducing saturated fat and increasing vegetable intake He is wife is at [facility] during [place]  11/01/2018 OV: Dylan Hall calls in today chronic medical management, Tobacco Use disorder, HLD, Depression, and Obesity He reports "eating whatever is in sight" He continues to walks >3-4 miles/day when working He reports drinking just a few sips of water/day, prefers to hydrate with coffee He continues to use tobacco- pack/day, again declines tobacco cessation He reports stable mood, denies SI/HI  Recent labs TSH-WNL, 2.530 A1c- WNL, 5.5 CBC-stable CMP-stable The 10-year ASCVD risk score [name], et al., 2013) is: 7.2% Values used to calculate the score:  Age: 49 years  Sex: Male  Is Non-Hispanic African  American: No  Diabetic: No  Tobacco smoker: Yes  Systolic Blood Pressure: 671 mmHg  Is BP treated: No  HDL Cholesterol: 35 mg/dL  Total Cholesterol: 159 mg/dL LDL-94 Currently on Crestor 36m QD, Fenofibrate 451mQD He denies myalgia's- encouraged to drink more plain water  08/12/2019 OV: Mr. SaHaynesworthalls in today for f/u:  Obesity, HLD, Recurrent Depression. He reports stable mood on Duloxetine 3031mhas been on for years. He reports a diet high in saturated fat, however states "I eat my vegetables as well". He reports a healthy appetite, states "I eat all the time". 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 He continues to smoke tobacco- pack/day-declined tobacco cessation. He reports nasal drainage and non-productive cough >48 hrs. His son went for SARS-CoV-2 testing last week, results still pending. Pt is going to his employer SARS-CoV-2 testing. Discussed if his test is negative and he is still experiencing sx's to f/u with primary care. He denies fever/body aches/HA/loss of sense of smell or taste.  Basic information for mRNA COVID-19 vaccines Please note that these CDC guidelines can change from week to week, and even day to day.  Refer to the CDCChatham Orthopaedic Surgery Asc LLCbsite for the most current recommendations for any given day at httGeekRegister.com.ee1) Under the EUAs (emergency use authorization), the following age groups are authorized to receive the COVID-19 vaccination: . Pfizer-BioNTech: ages ?16 years . Moderna: ages ?18 years . Children and adolescents outside of these authorized age groups should not receive COVID-19 vaccination at this time   2) You are not eligible to receive a COVID-19 vaccine if: . You have received another vaccine in the last 14-days (  example: flu shot, shingles, tetanus, etc.) . You currently have a positive test for COVID-19. The vaccine must be deferred until you have recovered from  the acute illness and the criteria has been met for you to discontinue isolation. . You have received passive antibody therapy (monoclonal antibodies or convalescent serum) as treatment for COVID-19 within the past 90 days   3) Currently COVID-19 vaccines are contraindicated in patients who have had an anaphylactic reaction to polyethylene glycol ( ie. GoLYTELY bowel prep or MiraLAX ) or polysorbate products (often used as a thickening agent for many ice creams, frozen custard and whipped dessert products etc).   Also, if you had an anaphylactic reaction to your first COVID-19 vaccination, you cannot receive your second.     - Also if you are currently pregnant please check with your obstetrician to see what their recommendations are regarding vaccination.   - Also, if you have a chronic condition such as CLL, RA or other immunocompromising conditions, please know that no data are currently available (or very limited data) on the safety and efficacy of mRNA COVID-19 vaccines in persons with autoimmune/ immunocompromising conditions.   Please seek the advice of your specialist regarding whether or not you should receive your COVID-19 vaccination.    Patient Care Team    Relationship Specialty Notifications Start End  Mina Marble D, NP PCP - General Family Medicine  08/08/16   Mcarthur Rossetti, MD Consulting Physician Orthopedic Surgery  01/29/19     Patient Active Problem List   Diagnosis Date Noted  . Healthcare maintenance 01/29/2018  . Depression, recurrent ([place]) 08/08/2016  . Hyperlipidemia 08/08/2016  . Overweight on examination 08/08/2016  . Tobacco use disorder 08/08/2016  . Neck pain 08/08/2016  . Family history of diabetes mellitus in mother 08/08/2016     Past Medical History:  Diagnosis Date  . Anxiety   . Depression   . Hyperlipidemia      Past Surgical History:  Procedure Laterality Date  . FINGER AMPUTATION Left 01/2004   index  . ROTATOR CUFF REPAIR        Family History  Problem Relation Age of Onset  . Diabetes Mother   . Heart disease Mother   . Heart attack Mother   . Dementia Mother   . Alcohol abuse Father   . Hypertension Maternal Aunt   . Dementia Maternal Aunt   . Alcohol abuse Maternal Uncle   . Heart attack Maternal Uncle   . Hypertension Maternal Uncle   . Dementia Maternal Uncle   . Alcohol abuse Paternal Uncle      Social History   Substance and Sexual Activity  Drug Use No     Social History   Substance and Sexual Activity  Alcohol Use Yes  . Alcohol/week: 2.0 standard drinks  . Types: 2 Cans of beer per week     Social History   Tobacco Use  Smoking Status Current Some Day Smoker  . Packs/day: 1.00  . Years: 26.00  . Pack years: 26.00  . Types: Cigarettes  Smokeless Tobacco Current User  . Types: Snuff     Outpatient Encounter Medications as of 08/12/2019  Medication Sig Note  . cyclobenzaprine (FLEXERIL) 10 MG tablet Take 1 tablet (10 mg total) by mouth 3 (three) times daily as needed for muscle spasms. 01/29/2019: Rarely takes this.  . DULoxetine (CYMBALTA) 30 MG capsule TAKE 1 CAPSULE BY MOUTH EVERY DAY   . fenofibrate (TRICOR) 48 MG tablet TAKE 1 TABLET  BY MOUTH DAILY. PATIENT MUST HAVE TELEMEDICINE VISIT PRIOR TO ANY FURTHER REFILLS   . LORATADINE ALLERGY RELIEF PO Take 10 mg by mouth at bedtime.   . rosuvastatin (CRESTOR) 20 MG tablet TAKE 1 TABLET BY MOUTH DAILY. PATIENT MUST HAVE TELEMEDICINE VISIT PRIOR TO ANY FURTHER REFILLS   . [DISCONTINUED] acetaminophen-codeine (TYLENOL #3) 300-30 MG tablet Take 1-2 tablets by mouth every 8 (eight) hours as needed for moderate pain.   . [DISCONTINUED] aspirin (ASPIRIN CHILDRENS) 81 MG chewable tablet Chew 1 tablet (81 mg total) by mouth daily.   . [DISCONTINUED] methocarbamol (ROBAXIN) 500 MG tablet Take 1 tablet (500 mg total) by mouth every 8 (eight) hours as needed.   . [DISCONTINUED] oxyCODONE (ROXICODONE) 5 MG immediate release tablet Take  1 tablet (5 mg total) by mouth every 4 (four) hours as needed.    No facility-administered encounter medications on file as of 08/12/2019.    Allergies: Codeine  Body mass index is 33.44 kg/m.  Temperature (!) 97.4 F (36.3 C), temperature source Oral, height 6' 2.5" (1.892 m), weight 264 lb (119.7 kg). Review of Systems: General:   Denies fever, chills, unexplained weight loss.  Optho/Auditory:   Denies visual changes, blurred vision/LOV Respiratory:   Denies SOB, DOE more than baseline levels.  Cardiovascular:   Denies chest pain, palpitations, new onset peripheral edema  Gastrointestinal:   Denies nausea, vomiting, diarrhea.  Genitourinary: Denies dysuria, freq/ urgency, flank pain or discharge from genitals.  Endocrine:     Denies hot or cold intolerance, polyuria, polydipsia. Musculoskeletal:   Denies unexplained myalgias, joint swelling, unexplained arthralgias, gait problems.  Skin:  Denies rash, suspicious lesions Neurological:     Denies dizziness, unexplained weakness, numbness  Psychiatric/Behavioral:   Denies mood changes, suicidal or homicidal ideations, hallucinations  Observations/Objective: No acute distress noted during telephone conversation.  Assessment and Plan: Continue all medications as directed/ Remain well hydrated with water, reduce saturated fat intake. Reduce to stop tobacco use- if interested in tobacco cessation- call clinic. When needs refill on Rx- call pharmacy. He reports nasal drainage and non-productive cough >48 hrs. His son went for SARS-CoV-2 testing last week, results still pending. Pt is going to his employer SARS-CoV-2 testing. Discussed if his test is negative and he is still experiencing sx's to f/u with primary care. He denies fever/body aches/HA/loss of sense of smell or taste.   Follow Up Instructions: Fasting labs 10/2019 Acute sx's continue >7-10 days f/u with TeleMedicine appt OV 6 months    I discussed the assessment and  treatment plan with the patient. The patient was provided an opportunity to ask questions and all were answered. The patient agreed with the plan and demonstrated an understanding of the instructions.   The patient was advised to call back or seek an in-person evaluation if the symptoms worsen or if the condition fails to improve as anticipated.  I provided 14 minutes of non-face-to-face time during this encounter.   Esaw Grandchild, NP

## 2019-08-12 NOTE — Assessment & Plan Note (Signed)
He reports stable mood on Duloxetine 30mg - has been on for years.

## 2019-08-12 NOTE — Assessment & Plan Note (Signed)
Reduce saturated fat, increase regular exercise Continue Rosuvastatin 20mg  QD, Fenofibrate 48mg  Fasting Labs 10/2019

## 2019-08-13 ENCOUNTER — Ambulatory Visit: Payer: 59

## 2019-08-20 ENCOUNTER — Ambulatory Visit: Payer: 59 | Attending: Orthopedic Surgery

## 2019-08-20 ENCOUNTER — Other Ambulatory Visit: Payer: Self-pay

## 2019-08-20 DIAGNOSIS — M25512 Pain in left shoulder: Secondary | ICD-10-CM | POA: Diagnosis present

## 2019-08-20 DIAGNOSIS — M6281 Muscle weakness (generalized): Secondary | ICD-10-CM | POA: Diagnosis present

## 2019-08-20 DIAGNOSIS — M25612 Stiffness of left shoulder, not elsewhere classified: Secondary | ICD-10-CM | POA: Diagnosis present

## 2019-08-20 NOTE — Therapy (Signed)
Dylan Hall, [place], [address] Phone: [phone]   Fax:  [phone]  Physical Therapy Treatment  Patient Details  Name: Dylan Hall MRN: [id number] Date of Birth: [birth year] No data recorded  Encounter Date: 08/20/2019  PT End of Session - 08/20/19 1521    Visit Number  2    Number of Visits  16    Date for PT Re-Evaluation  10/04/19    Authorization Type  UHC    PT Start Time  0330    PT Stop Time  0415    PT Time Calculation (min)  45 min       Past Medical History:  Diagnosis Date  . Anxiety   . Depression   . Hyperlipidemia     Past Surgical History:  Procedure Laterality Date  . FINGER AMPUTATION Left 01/2004   index  . ROTATOR CUFF REPAIR      There were no vitals filed for this visit.  Subjective Assessment - 08/20/19 1530    Subjective  Stiff particularly going behind his back    Currently in Pain?  No/denies   except end range  stretch        [facility] PT Assessment - 08/20/19 0001      AROM   Left Shoulder Flexion  120 Degrees    Left Shoulder Internal Rotation  --   able to touch RT lower lumbar spine   Left Shoulder External Rotation  55 Degrees                   [facility] Adult PT Treatment/Exercise - 08/20/19 0001      Exercises   Exercises  Shoulder      Shoulder Exercises: ROM/Strengthening   [place]  flexion x 20  overhead      Manual Therapy   Manual Therapy  Passive ROM;Manual Traction;Soft tissue mobilization;Joint mobilization    Joint Mobilization  GR 3-4 distraction with posterior and inferior glides     Soft tissue mobilization  anterior and posterio axill    Passive ROM  all planes to tolerance      HEP reveiwed and he was able to do correctly         PT Short Term Goals - 08/08/19 [id number]      PT SHORT TERM GOAL #1   Title  He will be indpendent with initial hEP    Time  2    Period  Weeks    Status  New      PT SHORT TERM  GOAL #2   Title  He will improve AROM LT shoulder to equal RT    Time  4    Period  Weeks    Status  New        PT Long Term Goals - 08/08/19 [id number]      PT LONG TERM GOAL #1   Title  He will demo indepoendet HEp with all exer issued    Time  8    Period  Weeks    Status  New      PT LONG TERM GOAL #2   Title  He will demo normal strength with no pain LT shoulder    Time  8    Period  Weeks    Status  New      PT LONG TERM GOAL #3   Title  He will be able to lift 25# floor to waist without pain  Time  8    Period  Weeks    Status  New      PT LONG TERM GOAL #4   Title  He will report doing all normal activity at home with LT arm    Time  8    Period  Weeks    Status  New            Plan - 08/20/19 1621    Clinical Impression Statement  Progressing with ROM . No significant pain .  He continues to do his HEP/    PT Treatment/Interventions  Therapeutic activities;Therapeutic exercise;Manual techniques;Passive range of motion;Patient/family education;Cryotherapy    PT Next Visit Plan  Continue mobs and ROM .  start isometrics versus band exercises next visit    PT Home Exercise Plan  PROM ER /flexion / hor addust/ behind back    Consulted and Agree with Plan of Care  Patient       Patient will benefit from skilled therapeutic intervention in order to improve the following deficits and impairments:  Pain, Decreased strength, Decreased range of motion, Decreased activity tolerance  Visit Diagnosis: Left shoulder pain, unspecified chronicity  Stiffness of left shoulder, not elsewhere classified  Muscle weakness (generalized)     Problem List Patient Active Problem List   Diagnosis Date Noted  . Healthcare maintenance 01/29/2018  . Depression, recurrent ([place]) 08/08/2016  . Hyperlipidemia 08/08/2016  . Overweight on examination 08/08/2016  . Tobacco use disorder 08/08/2016  . Neck pain 08/08/2016  . Family history of diabetes mellitus in mother  08/08/2016    Dylan Hall  PT 08/20/2019, 4:26 PM  [facility] [address] [place], [place], [address] Phone: [phone]   Fax:  [phone]  Name: Dylan Hall MRN: [id number] Date of Birth: [birth year]

## 2019-08-27 ENCOUNTER — Other Ambulatory Visit: Payer: Self-pay

## 2019-08-27 ENCOUNTER — Ambulatory Visit: Payer: 59

## 2019-08-27 DIAGNOSIS — M25512 Pain in left shoulder: Secondary | ICD-10-CM | POA: Diagnosis not present

## 2019-08-27 DIAGNOSIS — M25612 Stiffness of left shoulder, not elsewhere classified: Secondary | ICD-10-CM

## 2019-08-27 DIAGNOSIS — M6281 Muscle weakness (generalized): Secondary | ICD-10-CM

## 2019-08-27 NOTE — Patient Instructions (Signed)
Seated shoulder flexion and scaption  Prone shoulder extension and hor abduction . Side ER,  X 12 reps no weight ddaily 1-2x    Bicep curls x 12-15 bilateral  1-2#  Daily  Cued for all exercises no painand on pushing through tightness

## 2019-08-27 NOTE — Therapy (Addendum)
[place], [place], [address] Phone: [phone]   Fax:  [phone]  Physical Therapy Treatment/discharge  Patient Details  Name: Dylan Hall MRN: [id number] Date of Birth: 1971/01/27 No data recorded  Encounter Date: 08/27/2019  PT End of Session - 08/27/19 1538    Visit Number  3    Number of Visits  16    Date for PT Re-Evaluation  10/04/19    Authorization Type  [employer]    PT Start Time  0330    PT Stop Time  0415    PT Time Calculation (min)  45 min    Activity Tolerance  Patient tolerated treatment well    Behavior During Therapy  [facility] for tasks assessed/performed       Past Medical History:  Diagnosis Date  . Anxiety   . Depression   . Hyperlipidemia     Past Surgical History:  Procedure Laterality Date  . FINGER AMPUTATION Left 01/2004   index  . ROTATOR CUFF REPAIR      There were no vitals filed for this visit.                    [place] Adult PT Treatment/Exercise - 08/27/19 0001      Shoulder Exercises: Supine   Protraction  Left;15 reps    Protraction Weight (lbs)  2    Other Supine Exercises  bicep curls 2# x 15      Shoulder Exercises: Seated   Flexion  Both;12 reps    Flexion Weight (lbs)  0    Other Seated Exercises  scaption x 12 no weight bilaterally       Shoulder Exercises: Prone   Extension  Left;12 reps    Horizontal ABduction 1  Left;12 reps    Other Prone Exercises  cued to involve scapula reetrraction with lift       Shoulder Exercises: Sidelying   External Rotation  Left;15 reps    External Rotation Weight (lbs)  1      Shoulder Exercises: Standing   External Rotation  Left;12 reps    Theraband Level (Shoulder External Rotation)  Level 1 (Yellow)    Internal Rotation  Left;15 reps    Theraband Level (Shoulder Internal Rotation)  Level 1 (Yellow)      Shoulder Exercises: Pulleys   Flexion  3 minutes      Shoulder Exercises:  ROM/Strengthening   [place]  flexion x 20  overhead             PT Education - 08/27/19 1715    Education Details  HEP,  no pain and no pushing through tightness    Person(s) Educated  Patient    Methods  Explanation;Verbal cues;Handout;Tactile cues    Comprehension  Verbalized understanding;Returned demonstration       PT Short Term Goals - 08/08/19 7544      PT SHORT TERM GOAL #1   Title  He will be indpendent with initial hEP    Time  2    Period  Weeks    Status  New      PT SHORT TERM GOAL #2   Title  He will improve AROM LT shoulder to equal RT    Time  4    Period  Weeks    Status  New        PT Long Term Goals - 08/08/19 9201      PT LONG TERM GOAL #  1   Title  He will demo indepoendet HEp with all exer issued    Time  8    Period  Weeks    Status  New      PT LONG TERM GOAL #2   Title  He will demo normal strength with no pain LT shoulder    Time  8    Period  Weeks    Status  New      PT LONG TERM GOAL #3   Title  He will be able to lift 25# floor to waist without pain    Time  8    Period  Weeks    Status  New      PT LONG TERM GOAL #4   Title  He will report doing all normal activity at home with LT arm    Time  8    Period  Weeks    Status  New            Plan - 08/27/19 1539    Clinical Impression Statement  doingwell tolerating stretching and initial active ROM without pain and cautioned to stop with any pain.    Next week will add to load max 3# and measure ROM and manual as needed    PT Treatment/Interventions  Therapeutic activities;Therapeutic exercise;Manual techniques;Passive range of motion;Patient/family education;Cryotherapy    PT Next Visit Plan  review HEP add light load with no pain 3# max. Measure. cont ROm and mobs as needed    PT Home Exercise Plan  PROM ER /flexion / hor addust/ behind back,            AROM shoulder flexion/scaption  side ER/ prone ext and hor abduction , bicep curls    Consulted and Agree with  Plan of Care  Patient       Patient will benefit from skilled therapeutic intervention in order to improve the following deficits and impairments:  Pain, Decreased strength, Decreased range of motion, Decreased activity tolerance  Visit Diagnosis: Stiffness of left shoulder, not elsewhere classified  Muscle weakness (generalized)  Left shoulder pain, unspecified chronicity     Problem List Patient Active Problem List   Diagnosis Date Noted  . Healthcare maintenance 01/29/2018  . Depression, recurrent ([place]) 08/08/2016  . Hyperlipidemia 08/08/2016  . Overweight on examination 08/08/2016  . Tobacco use disorder 08/08/2016  . Neck pain 08/08/2016  . Family history of diabetes mellitus in mother 08/08/2016    Dylan Hall PT  08/27/2019, 5:18 PM  Dylan Hall [facility] [address] [place], [place], [address] Phone: [phone]   Fax:  [phone]  Name: Dylan Hall MRN: [id number] Date of Birth: [birth year]  PHYSICAL THERAPY DISCHARGE SUMMARY  Visits from Start of Care: 3  Current functional level related to goals / functional outcomes: Unknown but on his follow up visit with Dr Marlou Sa he reported doing full duty work and not taking any pain meds. He did not return toPT after this visit.    Remaining deficits: Unknown but it appears he has not returned to Dr Marlou Sa with any issues   Education / Equipment: HEP Plan:                                                    Patient goals were not met. Patient is  being discharged due to not returning since the last visit.  ?????    Dylan Hall  PT  10/24/19

## 2019-09-03 ENCOUNTER — Ambulatory Visit: Payer: 59

## 2019-09-09 ENCOUNTER — Encounter: Payer: Self-pay | Admitting: Orthopedic Surgery

## 2019-09-09 ENCOUNTER — Other Ambulatory Visit: Payer: Self-pay

## 2019-09-09 ENCOUNTER — Ambulatory Visit (INDEPENDENT_AMBULATORY_CARE_PROVIDER_SITE_OTHER): Payer: 59 | Admitting: Orthopedic Surgery

## 2019-09-09 VITALS — Ht 76.0 in | Wt 265.0 lb

## 2019-09-09 DIAGNOSIS — M75112 Incomplete rotator cuff tear or rupture of left shoulder, not specified as traumatic: Secondary | ICD-10-CM

## 2019-09-13 ENCOUNTER — Encounter: Payer: Self-pay | Admitting: Orthopedic Surgery

## 2019-09-13 NOTE — Progress Notes (Signed)
Post-Op Visit Note   Patient: Dylan Hall           Date of Birth: [birth year]           MRN: [id number] Visit Date: 09/09/2019 PCP: Esaw Grandchild, NP   Assessment & Plan:  Chief Complaint:  Chief Complaint  Patient presents with  . Left Shoulder - Follow-up   Visit Diagnoses:  1. Nontraumatic incomplete tear of left rotator cuff     Plan: Patient is a 49 year old male who presents s/p left shoulder rotator cuff repair on 06/24/2019.  Patient notes that he is doing well for the most part.  He states that he is 80% better.  He does note occasional pain that shoots down his arm if he reaches out and down.  He felt a pull in the front of his shoulder last Wednesday that resulted in occasional sharp pain for 3 to 4 days.  However this is resolved.  He is back to his baseline of just occasional pain with reaching out.  He is going to physical therapy 2 times and is now doing a home exercise program that is working well for him.  He is not taking anything for pain.  He is returned to work full-time without any issues.  He takes occasional Robaxin when he notices some muscle spasms.  On exam he has excellent subscapularis strength and excellent biceps strength.  Incisions have healed well.  He does have some stiffness with abduction but was educated of some exercises he can do at home to improve this.  Plan to follow-up with the office as needed.  Follow-Up Instructions: No follow-ups on file.   Orders:  No orders of the defined types were placed in this encounter.  No orders of the defined types were placed in this encounter.   Imaging: No results found.  PMFS History: Patient Active Problem List   Diagnosis Date Noted  . Healthcare maintenance 01/29/2018  . Depression, recurrent ([place]) 08/08/2016  . Hyperlipidemia 08/08/2016  . Overweight on examination 08/08/2016  . Tobacco use disorder 08/08/2016  . Neck pain 08/08/2016  . Family history of diabetes mellitus in mother  08/08/2016   Past Medical History:  Diagnosis Date  . Anxiety   . Depression   . Hyperlipidemia     Family History  Problem Relation Age of Onset  . Diabetes Mother   . Heart disease Mother   . Heart attack Mother   . Dementia Mother   . Alcohol abuse Father   . Hypertension Maternal Aunt   . Dementia Maternal Aunt   . Alcohol abuse Maternal Uncle   . Heart attack Maternal Uncle   . Hypertension Maternal Uncle   . Dementia Maternal Uncle   . Alcohol abuse Paternal Uncle     Past Surgical History:  Procedure Laterality Date  . FINGER AMPUTATION Left 01/2004   index  . ROTATOR CUFF REPAIR     Social History   Occupational History  . Not on file  Tobacco Use  . Smoking status: Current Some Day Smoker    Packs/day: 1.00    Years: 26.00    Pack years: 26.00    Types: Cigarettes  . Smokeless tobacco: Current User    Types: Snuff  Substance and Sexual Activity  . Alcohol use: Yes    Alcohol/week: 2.0 standard drinks    Types: 2 Cans of beer per week  . Drug use: No  . Sexual activity: Yes    Birth  control/protection: None

## 2019-09-29 ENCOUNTER — Other Ambulatory Visit: Payer: Self-pay | Admitting: Adult Health

## 2019-10-01 ENCOUNTER — Other Ambulatory Visit: Payer: Self-pay | Admitting: Adult Health

## 2019-10-01 MED ORDER — ROSUVASTATIN CALCIUM 20 MG PO TABS: ORAL_TABLET | ORAL | 1 refills | Status: DC

## 2019-10-01 MED ORDER — FENOFIBRATE 48 MG PO TABS: ORAL_TABLET | ORAL | 1 refills | Status: DC

## 2019-10-01 NOTE — Telephone Encounter (Signed)
We have not prescribed these medications for the patient previously.  Please review and refill if appropriate.  T. Ermine Stebbins, CMA  

## 2019-10-01 NOTE — Telephone Encounter (Signed)
Pt called states he had his OV w/ provider 3/1 but meds were not approved for refills.  --Forwarding request to med asst for these Rxs :    1)---Fenofibrate (TRICOR) 48 MG tablet [id number] DISCONTINUED  Order Details Dose: 48 mg Route: Oral Frequency: Daily  Dispense Quantity: 90 tablet Refills: 1       Sig: Take 1 tablet (48 mg total) by mouth daily. PATIENT MUST HAVE TELEMEDICINE VISIT PRIOR TO ANY FURTHER REFILLS        2)---rosuvastatin (CRESTOR) 20 MG tablet [id number] DISCONTINUED  Order Details Dose: 20 mg Route: Oral Frequency: Daily  Dispense Quantity: 90 tablet Refills: 1       Sig: Take 1 tablet (20 mg total) by mouth daily. PATIENT MUST HAVE TELEMEDICINE VISIT PRIOR TO ANY FURTHER REFILLS       ----- Provider has not prescribed this med before but patient is asking for refill on An Allergy Rx due to seasonal hayfever:   LORATADINE ALLERGY RELIEF PO [id number]   Order Details Dose: 10 mg Route: Oral Frequency: Daily at bedtime  Dispense Quantity: -- Refills: --       Sig: Take 10 mg by mouth at bedtime.   --Patient  Uses :   [id number] [address], [place]. [phone] (Phone) [phone] (Fax)   --Dion Body

## 2019-10-01 NOTE — Telephone Encounter (Signed)
call pharmacy and cancel the 2 medicines as written- patient's TriCor and Crestor.    Only give him one, 90-day supply with 0 refills under my name.       --> Please follow this general rule for all future refills of medications for any patients since I will not be here past 5 wks.  Thank you   And please let patient know that Claritin is over-the-counter.  It does not come in a prescription and that is why we have never prescribed it

## 2019-10-02 ENCOUNTER — Telehealth: Payer: Self-pay | Admitting: Family Medicine

## 2019-10-02 MED ORDER — LEVOCETIRIZINE DIHYDROCHLORIDE 5 MG PO TABS: 5.0000 mg | ORAL_TABLET | Freq: Every day | ORAL | 0 refills | Status: DC

## 2019-10-02 NOTE — Telephone Encounter (Signed)
Recommend xyzal 5mg  1 po qhs, disp 90, no RF.  If pt agrees to this, please send to his pharmacy

## 2019-10-02 NOTE — Telephone Encounter (Signed)
Called [facility] and spoke with Cristie Hem. Requested them to remove refill from order. AS, CMA

## 2019-10-02 NOTE — Telephone Encounter (Signed)
Patient is requesting a prescription for allergy medications. Patient requested Loratadine but was advised per Dr. Raliegh Scarlet could purchase OTC. Patient states he is requesting an allergy med at a lower cost.     Patient last seen in our office by [facility] on 08/2019.    Please advise. AS, CMA

## 2019-10-02 NOTE — Addendum Note (Signed)
Addended by: Mickel Crow on: 10/02/2019 10:35 AM   Modules accepted: Orders

## 2019-10-02 NOTE — Telephone Encounter (Signed)
Patient is aware of the below. Med sent to pharmacy. AS< CMA

## 2019-10-09 ENCOUNTER — Encounter (HOSPITAL_COMMUNITY)
Admission: EM | Disposition: A | Discharge status: Home / Self Care | Payer: Self-pay | Source: Home / Self Care | Attending: Emergency Medicine

## 2019-10-09 ENCOUNTER — Emergency Department (HOSPITAL_COMMUNITY): Payer: 59

## 2019-10-09 ENCOUNTER — Other Ambulatory Visit: Payer: Self-pay

## 2019-10-09 ENCOUNTER — Observation Stay (HOSPITAL_COMMUNITY)
Admission: EM | Admit: 2019-10-09 | Discharge: 2019-10-10 | Disposition: A | Discharge status: Home / Self Care | Payer: 59 | Attending: Surgery | Admitting: Surgery

## 2019-10-09 ENCOUNTER — Encounter (HOSPITAL_COMMUNITY): Payer: Self-pay | Admitting: Emergency Medicine

## 2019-10-09 ENCOUNTER — Emergency Department (HOSPITAL_COMMUNITY): Payer: 59 | Admitting: Anesthesiology

## 2019-10-09 DIAGNOSIS — E785 Hyperlipidemia, unspecified: Secondary | ICD-10-CM | POA: Insufficient documentation

## 2019-10-09 DIAGNOSIS — F329 Major depressive disorder, single episode, unspecified: Secondary | ICD-10-CM | POA: Diagnosis not present

## 2019-10-09 DIAGNOSIS — F17210 Nicotine dependence, cigarettes, uncomplicated: Secondary | ICD-10-CM | POA: Diagnosis not present

## 2019-10-09 DIAGNOSIS — Z79899 Other long term (current) drug therapy: Secondary | ICD-10-CM | POA: Diagnosis not present

## 2019-10-09 DIAGNOSIS — Z20822 Contact with and (suspected) exposure to covid-19: Secondary | ICD-10-CM | POA: Diagnosis not present

## 2019-10-09 DIAGNOSIS — G4733 Obstructive sleep apnea (adult) (pediatric): Secondary | ICD-10-CM | POA: Diagnosis not present

## 2019-10-09 DIAGNOSIS — F419 Anxiety disorder, unspecified: Secondary | ICD-10-CM | POA: Insufficient documentation

## 2019-10-09 DIAGNOSIS — Z885 Allergy status to narcotic agent status: Secondary | ICD-10-CM | POA: Insufficient documentation

## 2019-10-09 DIAGNOSIS — K353 Acute appendicitis with localized peritonitis, without perforation or gangrene: Principal | ICD-10-CM | POA: Insufficient documentation

## 2019-10-09 DIAGNOSIS — K3580 Unspecified acute appendicitis: Secondary | ICD-10-CM | POA: Diagnosis present

## 2019-10-09 HISTORY — PX: LAPAROSCOPIC APPENDECTOMY: SHX408

## 2019-10-09 LAB — COMPREHENSIVE METABOLIC PANEL
ALT: 27 U/L (ref 0–44)
AST: 17 U/L (ref 15–41)
Albumin: 4 g/dL (ref 3.5–5.0)
Alkaline Phosphatase: 56 U/L (ref 38–126)
Anion gap: 11 (ref 5–15)
BUN: 8 mg/dL (ref 6–20)
CO2: 21 mmol/L — ABNORMAL LOW (ref 22–32)
Calcium: 9.2 mg/dL (ref 8.9–10.3)
Chloride: 105 mmol/L (ref 98–111)
Creatinine, Ser: 0.86 mg/dL (ref 0.61–1.24)
GFR calc Af Amer: >60 mL/min (ref >60)
GFR calc non Af Amer: >60 mL/min (ref >60)
Glucose, Bld: 105 mg/dL — ABNORMAL HIGH (ref 70–99)
Potassium: 4 mmol/L (ref 3.5–5.1)
Sodium: 137 mmol/L (ref 135–145)
Total Bilirubin: 0.5 mg/dL (ref 0.3–1.2)
Total Protein: 6.5 g/dL (ref 6.5–8.1)

## 2019-10-09 LAB — URINALYSIS, ROUTINE W REFLEX MICROSCOPIC
Bilirubin Urine: NEGATIVE
Glucose, UA: NEGATIVE mg/dL
Ketones, ur: NEGATIVE mg/dL
Leukocytes,Ua: NEGATIVE
Nitrite: NEGATIVE
Protein, ur: NEGATIVE mg/dL
Specific Gravity, Urine: 1.008 (ref 1.005–1.030)
pH: 6 (ref 5.0–8.0)

## 2019-10-09 LAB — CBC WITH DIFFERENTIAL/PLATELET
Abs Immature Granulocytes: 0.05 10*3/uL (ref 0.00–0.07)
Basophils Absolute: 0.1 10*3/uL (ref 0.0–0.1)
Basophils Relative: 1 %
Eosinophils Absolute: 0.3 10*3/uL (ref 0.0–0.5)
Eosinophils Relative: 2 %
HCT: 47.1 % (ref 39.0–52.0)
Hemoglobin: 16.2 g/dL (ref 13.0–17.0)
Immature Granulocytes: 0 %
Lymphocytes Relative: 23 %
Lymphs Abs: 3.6 10*3/uL (ref 0.7–4.0)
MCH: 30.8 pg (ref 26.0–34.0)
MCHC: 34.4 g/dL (ref 30.0–36.0)
MCV: 89.5 fL (ref 80.0–100.0)
Monocytes Absolute: 1.8 10*3/uL — ABNORMAL HIGH (ref 0.1–1.0)
Monocytes Relative: 12 %
Neutro Abs: 9.6 10*3/uL — ABNORMAL HIGH (ref 1.7–7.7)
Neutrophils Relative %: 62 %
Platelets: 249 10*3/uL (ref 150–400)
RBC: 5.26 MIL/uL (ref 4.22–5.81)
RDW: 14 % (ref 11.5–15.5)
WBC: 15.5 10*3/uL — ABNORMAL HIGH (ref 4.0–10.5)
nRBC: 0 % (ref 0.0–0.2)

## 2019-10-09 LAB — RESPIRATORY PANEL BY RT PCR (FLU A&B, COVID)
Influenza A by PCR: NEGATIVE
Influenza B by PCR: NEGATIVE
SARS Coronavirus 2 by RT PCR: NEGATIVE

## 2019-10-09 LAB — LIPASE, BLOOD: Lipase: 26 U/L (ref 11–51)

## 2019-10-09 SURGERY — APPENDECTOMY, LAPAROSCOPIC
Anesthesia: General | Site: Abdomen

## 2019-10-09 MED ORDER — BUPIVACAINE HCL (PF) 0.25 % IJ SOLN
INTRAMUSCULAR | Status: AC
  Filled 2019-10-09: qty 30

## 2019-10-09 MED ORDER — HYDROMORPHONE HCL 1 MG/ML IJ SOLN
0.5000 mg | INTRAMUSCULAR | Status: DC | PRN
  Administered 2019-10-09: 0.5 mg via INTRAVENOUS
  Filled 2019-10-09: qty 1

## 2019-10-09 MED ORDER — LIDOCAINE-EPINEPHRINE 2 %-1:100000 IJ SOLN
INTRAMUSCULAR | Status: AC
  Filled 2019-10-09: qty 1

## 2019-10-09 MED ORDER — PROPOFOL 10 MG/ML IV BOLUS
INTRAVENOUS | Status: AC
  Filled 2019-10-09: qty 40

## 2019-10-09 MED ORDER — LIDOCAINE-EPINEPHRINE 1 %-1:100000 IJ SOLN
INTRAMUSCULAR | Status: AC
  Filled 2019-10-09: qty 1

## 2019-10-09 MED ORDER — MORPHINE SULFATE (PF) 4 MG/ML IV SOLN
4.0000 mg | Freq: Once | INTRAVENOUS | Status: AC
  Administered 2019-10-09: 4 mg via INTRAVENOUS
  Filled 2019-10-09: qty 1

## 2019-10-09 MED ORDER — MIDAZOLAM HCL 5 MG/5ML IJ SOLN
INTRAMUSCULAR | Status: DC | PRN
  Administered 2019-10-09: 2 mg via INTRAVENOUS

## 2019-10-09 MED ORDER — SODIUM CHLORIDE 0.9 % IV BOLUS
500.0000 mL | Freq: Once | INTRAVENOUS | Status: AC
  Administered 2019-10-09: 500 mL via INTRAVENOUS

## 2019-10-09 MED ORDER — LIDOCAINE HCL (CARDIAC) PF 100 MG/5ML IV SOSY
PREFILLED_SYRINGE | INTRAVENOUS | Status: DC | PRN
  Administered 2019-10-09: 100 mg via INTRATRACHEAL

## 2019-10-09 MED ORDER — SODIUM CHLORIDE 0.9 % IV SOLN
2.0000 g | Freq: Once | INTRAVENOUS | Status: AC
  Administered 2019-10-09: 2 g via INTRAVENOUS
  Filled 2019-10-09: qty 20

## 2019-10-09 MED ORDER — MIDAZOLAM HCL 2 MG/2ML IJ SOLN
INTRAMUSCULAR | Status: AC
  Filled 2019-10-09: qty 2

## 2019-10-09 MED ORDER — FENTANYL CITRATE (PF) 250 MCG/5ML IJ SOLN
INTRAMUSCULAR | Status: AC
  Filled 2019-10-09: qty 5

## 2019-10-09 MED ORDER — METRONIDAZOLE IN NACL 5-0.79 MG/ML-% IV SOLN
500.0000 mg | Freq: Once | INTRAVENOUS | Status: AC
  Administered 2019-10-09: 500 mg via INTRAVENOUS
  Filled 2019-10-09: qty 100

## 2019-10-09 MED ORDER — PROPOFOL 10 MG/ML IV BOLUS
INTRAVENOUS | Status: DC | PRN
  Administered 2019-10-09: 200 mg via INTRAVENOUS

## 2019-10-09 MED ORDER — FENTANYL CITRATE (PF) 250 MCG/5ML IJ SOLN
INTRAMUSCULAR | Status: DC | PRN
  Administered 2019-10-09: 100 ug via INTRAVENOUS
  Administered 2019-10-09 – 2019-10-10 (×3): 50 ug via INTRAVENOUS

## 2019-10-09 MED ORDER — SUCCINYLCHOLINE CHLORIDE 20 MG/ML IJ SOLN
INTRAMUSCULAR | Status: DC | PRN
  Administered 2019-10-09: 160 mg via INTRAVENOUS

## 2019-10-09 MED ORDER — IOHEXOL 300 MG/ML  SOLN
100.0000 mL | Freq: Once | INTRAMUSCULAR | Status: AC | PRN
  Administered 2019-10-09: 100 mL via INTRAVENOUS

## 2019-10-09 SURGICAL SUPPLY — 42 items
APPLIER CLIP 5 13 M/L LIGAMAX5 (MISCELLANEOUS)
BLADE CLIPPER SURG (BLADE) ×3 IMPLANT
CANISTER SUCT 3000ML PPV (MISCELLANEOUS) ×3 IMPLANT
CHLORAPREP W/TINT 26 (MISCELLANEOUS) ×3 IMPLANT
CLIP APPLIE 5 13 M/L LIGAMAX5 (MISCELLANEOUS) IMPLANT
COVER SURGICAL LIGHT HANDLE (MISCELLANEOUS) ×3 IMPLANT
COVER WAND RF STERILE (DRAPES) IMPLANT
CUTTER FLEX LINEAR 45M (STAPLE) ×3 IMPLANT
DERMABOND ADVANCED (GAUZE/BANDAGES/DRESSINGS) ×2
DERMABOND ADVANCED .7 DNX12 (GAUZE/BANDAGES/DRESSINGS) ×1 IMPLANT
ELECT REM PT RETURN 9FT ADLT (ELECTROSURGICAL) ×3
ELECTRODE REM PT RTRN 9FT ADLT (ELECTROSURGICAL) ×1 IMPLANT
GLOVE BIO SURGEON STRL SZ 6 (GLOVE) ×3 IMPLANT
GLOVE BIOGEL PI IND STRL 7.5 (GLOVE) ×1 IMPLANT
GLOVE BIOGEL PI INDICATOR 7.5 (GLOVE) ×2
GLOVE INDICATOR 6.5 STRL GRN (GLOVE) ×3 IMPLANT
GLOVE SURG SS PI 7.5 STRL IVOR (GLOVE) ×6 IMPLANT
GOWN STRL REUS W/ TWL LRG LVL3 (GOWN DISPOSABLE) ×2 IMPLANT
GOWN STRL REUS W/TWL LRG LVL3 (GOWN DISPOSABLE) ×6
GRASPER SUT TROCAR 14GX15 (MISCELLANEOUS) ×3 IMPLANT
KIT BASIN OR (CUSTOM PROCEDURE TRAY) ×3 IMPLANT
KIT TURNOVER KIT B (KITS) ×3 IMPLANT
NEEDLE INSUFFLATION 14GA 120MM (NEEDLE) ×3 IMPLANT
NS IRRIG 1000ML POUR BTL (IV SOLUTION) ×3 IMPLANT
PAD ARMBOARD 7.5X6 YLW CONV (MISCELLANEOUS) ×6 IMPLANT
POUCH SPECIMEN RETRIEVAL 10MM (ENDOMECHANICALS) ×3 IMPLANT
RELOAD 45 VASCULAR/THIN (ENDOMECHANICALS) ×3 IMPLANT
RELOAD STAPLE TA45 3.5 REG BLU (ENDOMECHANICALS) IMPLANT
SCISSORS LAP 5X35 DISP (ENDOMECHANICALS) IMPLANT
SET IRRIG TUBING LAPAROSCOPIC (IRRIGATION / IRRIGATOR) ×3 IMPLANT
SET TUBE SMOKE EVAC HIGH FLOW (TUBING) ×3 IMPLANT
SHEARS HARMONIC ACE PLUS 36CM (ENDOMECHANICALS) ×3 IMPLANT
SLEEVE ENDOPATH XCEL 5M (ENDOMECHANICALS) ×3 IMPLANT
SPECIMEN JAR SMALL (MISCELLANEOUS) ×3 IMPLANT
SUT MNCRL AB 4-0 PS2 18 (SUTURE) ×3 IMPLANT
TOWEL GREEN STERILE FF (TOWEL DISPOSABLE) ×3 IMPLANT
TRAY FOLEY W/BAG SLVR 16FR (SET/KITS/TRAYS/PACK) ×3
TRAY FOLEY W/BAG SLVR 16FR ST (SET/KITS/TRAYS/PACK) ×1 IMPLANT
TRAY LAPAROSCOPIC MC (CUSTOM PROCEDURE TRAY) ×3 IMPLANT
TROCAR XCEL 12X100 BLDLESS (ENDOMECHANICALS) ×3 IMPLANT
TROCAR XCEL NON-BLD 5MMX100MML (ENDOMECHANICALS) ×3 IMPLANT
WATER STERILE IRR 1000ML POUR (IV SOLUTION) ×3 IMPLANT

## 2019-10-09 NOTE — Anesthesia Preprocedure Evaluation (Signed)
Anesthesia Evaluation  Patient identified by MRN, date of birth, ID band Patient awake    Reviewed: Allergy & Precautions, NPO status , Patient's Chart, lab work & pertinent test results  Airway Mallampati: II  TM Distance: >3 FB Neck ROM: Full    Dental no notable dental hx. (+) Dental Advisory Given   Pulmonary Current Smoker,    Pulmonary exam normal breath sounds clear to auscultation       Cardiovascular negative cardio ROS Normal cardiovascular exam Rhythm:Regular Rate:Normal     Neuro/Psych PSYCHIATRIC DISORDERS Anxiety Depression negative neurological ROS     GI/Hepatic negative GI ROS, Neg liver ROS,   Endo/Other  negative endocrine ROS  Renal/GU negative Renal ROS     Musculoskeletal negative musculoskeletal ROS (+)   Abdominal   Peds  Hematology negative hematology ROS (+)   Anesthesia Other Findings   Reproductive/Obstetrics                             Anesthesia Physical Anesthesia Plan  ASA: III and emergent  Anesthesia Plan: General   Post-op Pain Management:    Induction: Intravenous, Rapid sequence and Cricoid pressure planned  PONV Risk Score and Plan: 2 and Ondansetron, Dexamethasone, Treatment may vary due to age or medical condition and Midazolam  Airway Management Planned: Oral ETT  Additional Equipment: None  Intra-op Plan:   Post-operative Plan: Extubation in OR  Informed Consent: I have reviewed the patients History and Physical, chart, labs and discussed the procedure including the risks, benefits and alternatives for the proposed anesthesia with the patient or authorized representative who has indicated his/her understanding and acceptance.     Dental advisory given  Plan Discussed with: CRNA  Anesthesia Plan Comments:         Anesthesia Quick Evaluation

## 2019-10-09 NOTE — ED Triage Notes (Signed)
Pt here from home with lower to right side abd pain , along with some nausea but no vomiting , pt still had his appendix and also has a history of kidney stones years ago

## 2019-10-09 NOTE — ED Provider Notes (Signed)
[facility] EMERGENCY DEPARTMENT Provider Note   CSN: [id number] Arrival date & time: 10/09/19  1817     History No chief complaint on file.   Dylan Hall is a 49 y.o. male presenting for evaluation of abdominal pain and nausea.  Patient states yesterday he started develop nausea.  Today, he had severe lower abdominal pain, worse in the right side.  Pain is worse with movement, nothing makes it better.  He has not taken anything including Tylenol ibuprofen.  He reports a low-grade fever of 99.5 at home today.  He denies cough, chest pain, shortness of breath, urinary symptoms, abnormal bowel movements.  He denies previous history of intra-abdominal problems or previous abdominal surgeries.  Last oral intake was 130 tonight.  He is not on blood thinners.  He takes duloxetine, Crestor, and fenofibrate daily, no other medicines.   HPI     Past Medical History:  Diagnosis Date  . Anxiety   . Depression   . Hyperlipidemia     Patient Active Problem List   Diagnosis Date Noted  . Healthcare maintenance 01/29/2018  . Depression, recurrent ([place]) 08/08/2016  . Hyperlipidemia 08/08/2016  . Overweight on examination 08/08/2016  . Tobacco use disorder 08/08/2016  . Neck pain 08/08/2016  . Family history of diabetes mellitus in mother 08/08/2016    Past Surgical History:  Procedure Laterality Date  . FINGER AMPUTATION Left 01/2004   index  . ROTATOR CUFF REPAIR         Family History  Problem Relation Age of Onset  . Diabetes Mother   . Heart disease Mother   . Heart attack Mother   . Dementia Mother   . Alcohol abuse Father   . Hypertension Maternal Aunt   . Dementia Maternal Aunt   . Alcohol abuse Maternal Uncle   . Heart attack Maternal Uncle   . Hypertension Maternal Uncle   . Dementia Maternal Uncle   . Alcohol abuse Paternal Uncle     Social History   Tobacco Use  . Smoking status: Current Some Day Smoker    Packs/day: 1.00    Years:  26.00    Pack years: 26.00    Types: Cigarettes  . Smokeless tobacco: Current User    Types: Snuff  Substance Use Topics  . Alcohol use: Yes    Alcohol/week: 2.0 standard drinks    Types: 2 Cans of beer per week  . Drug use: No    Home Medications Prior to Admission medications   Medication Sig Start Date End Date Taking? Authorizing Provider  cyclobenzaprine (FLEXERIL) 10 MG tablet Take 1 tablet (10 mg total) by mouth 3 (three) times daily as needed for muscle spasms. 02/14/17   Magnus Sinning, MD  DULoxetine (CYMBALTA) 30 MG capsule TAKE 1 CAPSULE BY MOUTH EVERY DAY 08/12/19   Danford, Valetta Fuller D, NP  fenofibrate (TRICOR) 48 MG tablet TAKE 1 TABLET BY MOUTH DAILY 10/01/19   Opalski, Neoma Laming, DO  levocetirizine (XYZAL) 5 MG tablet Take 1 tablet (5 mg total) by mouth at bedtime. 10/02/19   Opalski, Deborah, DO  rosuvastatin (CRESTOR) 20 MG tablet TAKE 1 TABLET BY MOUTH DAILY. 10/01/19   Opalski, Neoma Laming, DO  LORATADINE ALLERGY RELIEF PO Take 10 mg by mouth at bedtime.  10/01/19  [provider]    Allergies    Codeine  Review of Systems   Review of Systems  Constitutional: Positive for fever.  Gastrointestinal: Positive for abdominal pain and nausea.  All other  systems reviewed and are negative.   Physical Exam Updated Vital Signs BP 135/87   Pulse (!) 112   Temp 98.8 F (37.1 C) (Oral)   Resp 18   SpO2 96%   Physical Exam Vitals and nursing note reviewed.  Constitutional:      General: He is not in acute distress.    Appearance: He is well-developed.     Comments: Appears nontoxic  HENT:     Head: Normocephalic and atraumatic.  Eyes:     Conjunctiva/sclera: Conjunctivae normal.     Pupils: Pupils are equal, round, and reactive to light.  Cardiovascular:     Rate and Rhythm: Normal rate and regular rhythm.  Pulmonary:     Effort: Pulmonary effort is normal. No respiratory distress.     Breath sounds: Normal breath sounds. No wheezing.  Abdominal:     General:  There is no distension.     Palpations: Abdomen is soft. There is no mass.     Tenderness: There is abdominal tenderness. There is no guarding or rebound.     Comments: Tenderness to palpation of lower abdomen and right upper quadrant abdomen, however worst at McBurney's point.  Noted negative Rovsing's.  Negative psoas sign.  Negative rebound.  No signs of peritonitis.  Musculoskeletal:        General: Normal range of motion.     Cervical back: Normal range of motion and neck supple.  Skin:    General: Skin is warm and dry.     Capillary Refill: Capillary refill takes less than 2 seconds.  Neurological:     Mental Status: He is alert and oriented to person, place, and time.     ED Results / Procedures / Treatments   Labs (all labs ordered are listed, but only abnormal results are displayed) Labs Reviewed  COMPREHENSIVE METABOLIC PANEL - Abnormal; Notable for the following components:      Result Value   CO2 21 (*)    Glucose, Bld 105 (*)    All other components within normal limits  CBC WITH DIFFERENTIAL/PLATELET - Abnormal; Notable for the following components:   WBC 15.5 (*)    Neutro Abs 9.6 (*)    Monocytes Absolute 1.8 (*)    All other components within normal limits  URINALYSIS, ROUTINE W REFLEX MICROSCOPIC - Abnormal; Notable for the following components:   Hgb urine dipstick LARGE (*)    Bacteria, UA RARE (*)    All other components within normal limits  RESPIRATORY PANEL BY RT PCR (FLU A&B, COVID)  LIPASE, BLOOD    EKG None  Radiology CT ABDOMEN PELVIS W CONTRAST  Result Date: 10/09/2019 CLINICAL DATA:  Right lower abdominal pain, nausea EXAM: CT ABDOMEN AND PELVIS WITH CONTRAST TECHNIQUE: Multidetector CT imaging of the abdomen and pelvis was performed using the standard protocol following bolus administration of intravenous contrast. CONTRAST:  17mL OMNIPAQUE IOHEXOL 300 MG/ML  SOLN COMPARISON:  01/25/2007 FINDINGS: Lower chest: No acute pleural or parenchymal  lung disease. Hepatobiliary: No focal liver abnormality is seen. No gallstones, gallbladder wall thickening, or biliary dilatation. Pancreas: Unremarkable. No pancreatic ductal dilatation or surrounding inflammatory changes. Spleen: Normal in size without focal abnormality. Adrenals/Urinary Tract: Adrenal glands are unremarkable. Kidneys are normal, without renal calculi, focal lesion, or hydronephrosis. Bladder is unremarkable. Stomach/Bowel: There is a dilated inflamed appendix in the right lower quadrant measuring 11 mm in diameter. Significant right lower quadrant periappendiceal fat stranding. No evidence of perforation, fluid collection, or abscess. No bowel  obstruction or ileus. Vascular/Lymphatic: No significant vascular findings are present. No enlarged abdominal or pelvic lymph nodes. Reproductive: Prostate is unremarkable. Other: Significant fat stranding right lower quadrant. Trace free fluid. No free gas. Musculoskeletal: No acute or destructive bony lesions. Small bone islands are seen within the left side pubic symphysis and right inferior pubic ramus. Reconstructed images demonstrate no additional findings. IMPRESSION: 1. Acute uncomplicated appendicitis.  No perforation or abscess. Electronically Signed   By: Randa Ngo M.D.   On: 10/09/2019 20:14    Procedures Procedures (including critical care time)  Medications Ordered in ED Medications  cefTRIAXone (ROCEPHIN) 2 g in sodium chloride 0.9 % 100 mL IVPB (has no administration in time range)    And  metroNIDAZOLE (FLAGYL) IVPB 500 mg (has no administration in time range)  morphine 4 MG/ML injection 4 mg (4 mg Intravenous Given 10/09/19 1950)  sodium chloride 0.9 % bolus 500 mL (0 mLs Intravenous Stopped 10/09/19 2038)  iohexol (OMNIPAQUE) 300 MG/ML solution 100 mL (100 mLs Intravenous Contrast Given 10/09/19 2010)    ED Course  I have reviewed the triage vital signs and the nursing notes.  Pertinent labs & imaging results that  were available during my care of the patient were reviewed by me and considered in my medical decision making (see chart for details).    MDM Rules/Calculators/A&P                      Patient presenting for evaluation of abdominal pain and nausea.  On exam, patient appears nontoxic.  He does have tenderness to palpation the abdomen, most focally at McBurney's point.  High concern for appendicitis.  Also consider kidney stone.  Patient does have tenderness palpation the right upper quadrant, consider gallbladder although less likely.  Consider intestinal infection/viral illness.  Labs obtained from triage interpreted by me, shows mild leukocytosis at 15.  Of note, patient is mildly tachycardic, likely secondary to pain.  Patient also feels warm to the touch, although no acute no documented fever.  This might be contributing to his tachycardia.  Will give medicines for pain, will give fluids and obtain CT scan.  CT shows acute uncomplicated appendicitis.  IV antibiotics started.  Case discussed with attending, Dr. Roslynn Amble evaluated the patient.  Will obtain rapid Covid test and consult with general surgery.  Discussed with Dr. Windle Guard from general surgery who will evaluate and admit the patient.  Final Clinical Impression(s) / ED Diagnoses Final diagnoses:  Acute appendicitis, unspecified acute appendicitis type    Rx / DC Orders ED Discharge Orders    None       Franchot Heidelberg, PA-C 10/09/19 2112    Lucrezia Starch, MD 10/14/19 [phone]

## 2019-10-09 NOTE — H&P (Signed)
Surgical Evaluation  Chief Complaint: Abdominal pain  HPI: 49yo gentleman who presents with a 24hr history of nausea, abdominal discomfort which has evolved to right lower quadrant pain, and abnormal bowel movements. His wife notes that he has been having intermittent lower abdominal discomfort for about 2 weeks, but has been eating well, having normal bowel movements, and generally felt well until yesterday. He first felt nauseated and had mild discomfort and thought he had probably eaten something bad. Today the pan became more intense and localized to the right lower quadrant. Reports temp to 99.5 around 5pm today.  Work up in ER notable for WBC 15, CT scan shows appendicitis.   He has a history of OSA on CPAP and hyperlipidemia. No prior abdominal surgeries. 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 Codeine Itching    Past Medical History:  Diagnosis Date  . Anxiety   . Depression   . Hyperlipidemia     Past Surgical History:  Procedure Laterality Date  . FINGER AMPUTATION Left 01/2004   index  . ROTATOR CUFF REPAIR      Family History  Problem Relation Age of Onset  . Diabetes Mother   . Heart disease Mother   . Heart attack Mother   . Dementia Mother   . Alcohol abuse Father   . Hypertension Maternal Aunt   . Dementia Maternal Aunt   . Alcohol abuse Maternal Uncle   . Heart attack Maternal Uncle   . Hypertension Maternal Uncle   . Dementia Maternal Uncle   . Alcohol abuse Paternal Uncle     Social History   Socioeconomic History  . Marital status: Married    Spouse name: Not on file  . Number of children: Not on file  . Years of education: Not on file  . Highest education level: Not on file  Occupational History  . Not on file  Tobacco Use  . Smoking status: Current Some Day Smoker    Packs/day: 1.00    Years: 26.00    Pack years: 26.00    Types: Cigarettes  . Smokeless tobacco: Current User    Types:  Snuff  Substance and Sexual Activity  . Alcohol use: Yes    Alcohol/week: 2.0 standard drinks    Types: 2 Cans of beer per week  . Drug use: No  . Sexual activity: Yes    Birth control/protection: None  Other Topics Concern  . Not on file  Social History Narrative  . Not on file   Social Determinants of Health   Financial Resource Strain:   . Difficulty of Paying Living Expenses:   Food Insecurity:   . Worried About Charity fundraiser in the Last Year:   . Arboriculturist in the Last Year:   Transportation Needs:   . Film/video editor (Medical):   Marland Kitchen Lack of Transportation (Non-Medical):   Physical Activity:   . Days of Exercise per Week:   . Minutes of Exercise per Session:   Stress:   . Feeling of Stress :   Social Connections:   . Frequency of Communication with Friends and Family:   . Frequency of Social Gatherings with Friends and Family:   . Attends Religious Services:   . Active Member of Clubs or Organizations:   . Attends Archivist Meetings:   Marland Kitchen Marital Status:     No current facility-administered medications on file prior to encounter.  Current Outpatient Medications on File Prior to Encounter  Medication Sig Dispense Refill  . cyclobenzaprine (FLEXERIL) 10 MG tablet Take 1 tablet (10 mg total) by mouth 3 (three) times daily as needed for muscle spasms. 90 tablet 0  . DULoxetine (CYMBALTA) 30 MG capsule TAKE 1 CAPSULE BY MOUTH EVERY DAY 90 capsule 0  . fenofibrate (TRICOR) 48 MG tablet TAKE 1 TABLET BY MOUTH DAILY 90 tablet 1  . levocetirizine (XYZAL) 5 MG tablet Take 1 tablet (5 mg total) by mouth at bedtime. 90 tablet 0  . rosuvastatin (CRESTOR) 20 MG tablet TAKE 1 TABLET BY MOUTH DAILY. 90 tablet 1  . [DISCONTINUED] LORATADINE ALLERGY RELIEF PO Take 10 mg by mouth at bedtime.      Review of Systems: a complete, 10pt review of systems was completed with pertinent positives and negatives as documented in the HPI  Physical Exam: Vitals:    10/09/19 1830 10/09/19 1939  BP: 135/87   Pulse: (!) 112   Resp: 18   Temp: 98.4 F (36.9 C) 98.8 F (37.1 C)  SpO2: 96%    Gen: A&Ox3, no distress  Eyes: lids and conjunctivae normal, no icterus. Pupils equally round and reactive to light.  Neck: supple without mass or thyromegaly Chest: respiratory effort is normal. No crepitus or tenderness on palpation of the chest. Breath sounds equal.  Cardiovascular: RRR with palpable distal pulses, no pedal edema Gastrointestinal: soft, nondistended, tender with localized peritonitis in the RLQ, +referred tenderness on palpation of the LLQ. No mass, hepatomegaly or splenomegaly. No hernia. Lymphatic: no lymphadenopathy in the neck or groin Muscoloskeletal: no clubbing or cyanosis of the fingers.  Strength is symmetrical throughout.Marland Kitchen Neuro: cranial nerves grossly intact.  Sensation intact to light touch diffusely. Psych: appropriate mood and affect, normal insight/judgment intact  Skin: warm and dry   CBC Latest Ref Rng & Units 10/09/2019 10/29/2018 08/19/2016  WBC 4.0 - 10.5 K/uL 15.5(H) 11.4(H) 9.3  Hemoglobin 13.0 - 17.0 g/dL 16.2 16.3 15.9  Hematocrit 39.0 - 52.0 % 47.1 47.3 44.8  Platelets 150 - 400 K/uL 249 240 226    CMP Latest Ref Rng & Units 10/09/2019 10/29/2018 01/31/2017  Glucose 70 - 99 mg/dL 105(H) 80 -  BUN 6 - 20 mg/dL 8 9 -  Creatinine 0.61 - 1.24 mg/dL 0.86 0.82 -  Sodium 135 - 145 mmol/L 137 140 -  Potassium 3.5 - 5.1 mmol/L 4.0 4.5 -  Chloride 98 - 111 mmol/L 105 101 -  CO2 22 - 32 mmol/L 21(L) 23 -  Calcium 8.9 - 10.3 mg/dL 9.2 9.3 -  Total Protein 6.5 - 8.1 g/dL 6.5 6.8 7.2  Total Bilirubin 0.3 - 1.2 mg/dL 0.5 0.3 <0.2  Alkaline Phos 38 - 126 U/L 56 83 76  AST 15 - 41 U/L 17 23 21   ALT 0 - 44 U/L 27 37 26    No results found for: INR, PROTIME  Imaging: CT ABDOMEN PELVIS W CONTRAST  Result Date: 10/09/2019 CLINICAL DATA:  Right lower abdominal pain, nausea EXAM: CT ABDOMEN AND PELVIS WITH CONTRAST TECHNIQUE:  Multidetector CT imaging of the abdomen and pelvis was performed using the standard protocol following bolus administration of intravenous contrast. CONTRAST:  152mL OMNIPAQUE IOHEXOL 300 MG/ML  SOLN COMPARISON:  01/25/2007 FINDINGS: Lower chest: No acute pleural or parenchymal lung disease. Hepatobiliary: No focal liver abnormality is seen. No gallstones, gallbladder wall thickening, or biliary dilatation. Pancreas: Unremarkable. No pancreatic ductal dilatation or surrounding inflammatory changes. Spleen: Normal in size without  focal abnormality. Adrenals/Urinary Tract: Adrenal glands are unremarkable. Kidneys are normal, without renal calculi, focal lesion, or hydronephrosis. Bladder is unremarkable. Stomach/Bowel: There is a dilated inflamed appendix in the right lower quadrant measuring 11 mm in diameter. Significant right lower quadrant periappendiceal fat stranding. No evidence of perforation, fluid collection, or abscess. No bowel obstruction or ileus. Vascular/Lymphatic: No significant vascular findings are present. No enlarged abdominal or pelvic lymph nodes. Reproductive: Prostate is unremarkable. Other: Significant fat stranding right lower quadrant. Trace free fluid. No free gas. Musculoskeletal: No acute or destructive bony lesions. Small bone islands are seen within the left side pubic symphysis and right inferior pubic ramus. Reconstructed images demonstrate no additional findings. IMPRESSION: 1. Acute uncomplicated appendicitis.  No perforation or abscess. Electronically Signed   By: Randa Ngo M.D.   On: 10/09/2019 20:14     A/P: 49yo man with acute appendicitis, without evidence of perforation on CT but with localized peritonitis. I recommend proceeding with laparoscopic appendectomy. We discussed the surgery including risks of bleeding, infection, pain, scarring, injury to intra-abdominal structures, conversion to open surgery or more extensive resection, risk of staple line leak or  delayed abscess, failure to resolve symptoms, postoperative ileus, incisional hernia, as well as general risks of DVT/PE, pneumonia, stroke, heart attack, death. Questions were welcomed and answered to the patient's satisfaction. We'll proceed to the operating room this evening pending results of COVID.    Patient Active Problem List   Diagnosis Date Noted  . Healthcare maintenance 01/29/2018  . Depression, recurrent ([place]) 08/08/2016  . Hyperlipidemia 08/08/2016  . Overweight on examination 08/08/2016  . Tobacco use disorder 08/08/2016  . Neck pain 08/08/2016  . Family history of diabetes mellitus in mother 08/08/2016       Romana Juniper, MD [facility]  See AMION to contact appropriate on-call provider

## 2019-10-10 ENCOUNTER — Encounter: Payer: Self-pay | Admitting: *Deleted

## 2019-10-10 DIAGNOSIS — K3580 Unspecified acute appendicitis: Secondary | ICD-10-CM | POA: Diagnosis present

## 2019-10-10 LAB — MAGNESIUM: Magnesium: 1.8 mg/dL (ref 1.7–2.4)

## 2019-10-10 LAB — CBC
HCT: 43 % (ref 39.0–52.0)
Hemoglobin: 14.8 g/dL (ref 13.0–17.0)
MCH: 31.5 pg (ref 26.0–34.0)
MCHC: 34.4 g/dL (ref 30.0–36.0)
MCV: 91.5 fL (ref 80.0–100.0)
Platelets: 226 10*3/uL (ref 150–400)
RBC: 4.7 MIL/uL (ref 4.22–5.81)
RDW: 14.2 % (ref 11.5–15.5)
WBC: 15.3 10*3/uL — ABNORMAL HIGH (ref 4.0–10.5)
nRBC: 0 % (ref 0.0–0.2)

## 2019-10-10 LAB — BASIC METABOLIC PANEL
Anion gap: 11 (ref 5–15)
BUN: 7 mg/dL (ref 6–20)
CO2: 23 mmol/L (ref 22–32)
Calcium: 8.6 mg/dL — ABNORMAL LOW (ref 8.9–10.3)
Chloride: 104 mmol/L (ref 98–111)
Creatinine, Ser: 0.95 mg/dL (ref 0.61–1.24)
GFR calc Af Amer: >60 mL/min (ref >60)
GFR calc non Af Amer: >60 mL/min (ref >60)
Glucose, Bld: 160 mg/dL — ABNORMAL HIGH (ref 70–99)
Potassium: 3.7 mmol/L (ref 3.5–5.1)
Sodium: 138 mmol/L (ref 135–145)

## 2019-10-10 MED ORDER — CYCLOBENZAPRINE HCL 10 MG PO TABS: 10.0000 mg | ORAL_TABLET | Freq: Three times a day (TID) | ORAL | Status: DC | PRN

## 2019-10-10 MED ORDER — DIPHENHYDRAMINE HCL 50 MG/ML IJ SOLN
INTRAMUSCULAR | Status: AC
  Filled 2019-10-10: qty 1

## 2019-10-10 MED ORDER — SUGAMMADEX SODIUM 500 MG/5ML IV SOLN
INTRAVENOUS | Status: AC
  Filled 2019-10-10: qty 5

## 2019-10-10 MED ORDER — DIPHENHYDRAMINE HCL 50 MG/ML IJ SOLN: 25.0000 mg | Freq: Four times a day (QID) | INTRAMUSCULAR | Status: DC | PRN

## 2019-10-10 MED ORDER — ROCURONIUM BROMIDE 100 MG/10ML IV SOLN
INTRAVENOUS | Status: DC | PRN
  Administered 2019-10-10: 50 mg via INTRAVENOUS

## 2019-10-10 MED ORDER — 0.9 % SODIUM CHLORIDE (POUR BTL) OPTIME
TOPICAL | Status: DC | PRN
  Administered 2019-10-09: 1000 mL

## 2019-10-10 MED ORDER — PROMETHAZINE HCL 25 MG/ML IJ SOLN: 6.2500 mg | INTRAMUSCULAR | Status: DC | PRN

## 2019-10-10 MED ORDER — MEPERIDINE HCL 25 MG/ML IJ SOLN: 6.2500 mg | INTRAMUSCULAR | Status: DC | PRN

## 2019-10-10 MED ORDER — PHENYLEPHRINE HCL (PRESSORS) 10 MG/ML IV SOLN
INTRAVENOUS | Status: DC | PRN
  Administered 2019-10-10 (×3): 80 ug via INTRAVENOUS

## 2019-10-10 MED ORDER — ROCURONIUM BROMIDE 10 MG/ML (PF) SYRINGE
PREFILLED_SYRINGE | INTRAVENOUS | Status: AC
  Filled 2019-10-10: qty 10

## 2019-10-10 MED ORDER — SODIUM CHLORIDE 0.9 % IV SOLN
2.0000 g | INTRAVENOUS | Status: DC
  Filled 2019-10-10: qty 20

## 2019-10-10 MED ORDER — SODIUM CHLORIDE 0.9 % IR SOLN
Status: DC | PRN
  Administered 2019-10-09: 1000 mL

## 2019-10-10 MED ORDER — DULOXETINE HCL 30 MG PO CPEP
30.0000 mg | ORAL_CAPSULE | Freq: Every day | ORAL | Status: DC
  Administered 2019-10-10: 30 mg via ORAL
  Filled 2019-10-10: qty 1

## 2019-10-10 MED ORDER — ONDANSETRON HCL 4 MG/2ML IJ SOLN
INTRAMUSCULAR | Status: AC
  Filled 2019-10-10: qty 2

## 2019-10-10 MED ORDER — HYDRALAZINE HCL 20 MG/ML IJ SOLN: 10.0000 mg | INTRAMUSCULAR | Status: DC | PRN

## 2019-10-10 MED ORDER — ACETAMINOPHEN 500 MG PO TABS: 1000.0000 mg | ORAL_TABLET | Freq: Four times a day (QID) | ORAL | 0 refills | Status: AC | PRN

## 2019-10-10 MED ORDER — SUGAMMADEX SODIUM 200 MG/2ML IV SOLN
INTRAVENOUS | Status: DC | PRN
  Administered 2019-10-10: 300 mg via INTRAVENOUS

## 2019-10-10 MED ORDER — ONDANSETRON 4 MG PO TBDP: 4.0000 mg | ORAL_TABLET | Freq: Four times a day (QID) | ORAL | Status: DC | PRN

## 2019-10-10 MED ORDER — SCOPOLAMINE 1 MG/3DAYS TD PT72
MEDICATED_PATCH | TRANSDERMAL | Status: AC
  Filled 2019-10-10: qty 1

## 2019-10-10 MED ORDER — TRAMADOL HCL 50 MG PO TABS: 50.0000 mg | ORAL_TABLET | Freq: Four times a day (QID) | ORAL | Status: DC | PRN

## 2019-10-10 MED ORDER — DIPHENHYDRAMINE HCL 25 MG PO CAPS: 25.0000 mg | ORAL_CAPSULE | Freq: Four times a day (QID) | ORAL | Status: DC | PRN

## 2019-10-10 MED ORDER — IBUPROFEN 200 MG PO TABS: 800.0000 mg | ORAL_TABLET | Freq: Three times a day (TID) | ORAL | Status: AC | PRN

## 2019-10-10 MED ORDER — SODIUM CHLORIDE (PF) 0.9 % IJ SOLN
INTRAMUSCULAR | Status: AC
  Filled 2019-10-10: qty 10

## 2019-10-10 MED ORDER — ONDANSETRON HCL 4 MG/2ML IJ SOLN: 4.0000 mg | Freq: Four times a day (QID) | INTRAMUSCULAR | Status: DC | PRN

## 2019-10-10 MED ORDER — SODIUM CHLORIDE 0.9 % IV SOLN: INTRAVENOUS | Status: DC

## 2019-10-10 MED ORDER — ARTIFICIAL TEARS OPHTHALMIC OINT
TOPICAL_OINTMENT | OPHTHALMIC | Status: AC
  Filled 2019-10-10: qty 3.5

## 2019-10-10 MED ORDER — TRAMADOL HCL 50 MG PO TABS: 50.0000 mg | ORAL_TABLET | Freq: Four times a day (QID) | ORAL | 0 refills | Status: DC | PRN

## 2019-10-10 MED ORDER — OXYCODONE HCL 5 MG PO TABS
5.0000 mg | ORAL_TABLET | ORAL | Status: DC | PRN
  Administered 2019-10-10 (×2): 5 mg via ORAL
  Filled 2019-10-10 (×2): qty 1

## 2019-10-10 MED ORDER — KETOROLAC TROMETHAMINE 15 MG/ML IJ SOLN: 15.0000 mg | Freq: Four times a day (QID) | INTRAMUSCULAR | Status: DC | PRN

## 2019-10-10 MED ORDER — METRONIDAZOLE IN NACL 5-0.79 MG/ML-% IV SOLN
500.0000 mg | Freq: Three times a day (TID) | INTRAVENOUS | Status: DC
  Administered 2019-10-10: 500 mg via INTRAVENOUS
  Filled 2019-10-10: qty 100

## 2019-10-10 MED ORDER — DOCUSATE SODIUM 100 MG PO CAPS
100.0000 mg | ORAL_CAPSULE | Freq: Two times a day (BID) | ORAL | Status: DC
  Administered 2019-10-10 (×2): 100 mg via ORAL
  Filled 2019-10-10 (×3): qty 1

## 2019-10-10 MED ORDER — HYDROMORPHONE HCL 1 MG/ML IJ SOLN: 0.5000 mg | INTRAMUSCULAR | Status: DC | PRN

## 2019-10-10 MED ORDER — LACTATED RINGERS IV SOLN: INTRAVENOUS | Status: DC | PRN

## 2019-10-10 MED ORDER — LIDOCAINE 2% (20 MG/ML) 5 ML SYRINGE
INTRAMUSCULAR | Status: AC
  Filled 2019-10-10: qty 5

## 2019-10-10 MED ORDER — PHENYLEPHRINE 40 MCG/ML (10ML) SYRINGE FOR IV PUSH (FOR BLOOD PRESSURE SUPPORT)
PREFILLED_SYRINGE | INTRAVENOUS | Status: AC
  Filled 2019-10-10: qty 10

## 2019-10-10 MED ORDER — GABAPENTIN 300 MG PO CAPS
300.0000 mg | ORAL_CAPSULE | Freq: Two times a day (BID) | ORAL | Status: DC
  Administered 2019-10-10 (×2): 300 mg via ORAL
  Filled 2019-10-10 (×2): qty 1

## 2019-10-10 MED ORDER — STERILE WATER FOR IRRIGATION IR SOLN
Status: DC | PRN
  Administered 2019-10-10: 200 mL

## 2019-10-10 MED ORDER — LIDOCAINE-EPINEPHRINE 1 %-1:100000 IJ SOLN
INTRAMUSCULAR | Status: DC | PRN
  Administered 2019-10-09: 10 mL via INTRADERMAL

## 2019-10-10 MED ORDER — ACETAMINOPHEN 500 MG PO TABS
1000.0000 mg | ORAL_TABLET | Freq: Four times a day (QID) | ORAL | Status: DC
  Administered 2019-10-10: 1000 mg via ORAL
  Filled 2019-10-10: qty 2

## 2019-10-10 MED ORDER — HYDROMORPHONE HCL 1 MG/ML IJ SOLN: 0.2500 mg | INTRAMUSCULAR | Status: DC | PRN

## 2019-10-10 MED ORDER — EPHEDRINE SULFATE 50 MG/ML IJ SOLN
INTRAMUSCULAR | Status: DC | PRN
  Administered 2019-10-10: 5 mg via INTRAVENOUS

## 2019-10-10 MED ORDER — METOPROLOL TARTRATE 5 MG/5ML IV SOLN: 5.0000 mg | Freq: Four times a day (QID) | INTRAVENOUS | Status: DC | PRN

## 2019-10-10 MED ORDER — EPHEDRINE 5 MG/ML INJ
INTRAVENOUS | Status: AC
  Filled 2019-10-10: qty 10

## 2019-10-10 MED ORDER — POLYETHYLENE GLYCOL 3350 17 G PO PACK: 17.0000 g | PACK | Freq: Every day | ORAL | 0 refills | Status: DC | PRN

## 2019-10-10 MED ORDER — DIPHENHYDRAMINE HCL 50 MG/ML IJ SOLN
INTRAMUSCULAR | Status: DC | PRN
  Administered 2019-10-10: 12.5 mg via INTRAVENOUS

## 2019-10-10 MED ORDER — BISACODYL 10 MG RE SUPP: 10.0000 mg | Freq: Every day | RECTAL | Status: DC | PRN

## 2019-10-10 MED ORDER — SUCCINYLCHOLINE CHLORIDE 200 MG/10ML IV SOSY
PREFILLED_SYRINGE | INTRAVENOUS | Status: AC
  Filled 2019-10-10: qty 10

## 2019-10-10 MED ORDER — ENOXAPARIN SODIUM 40 MG/0.4ML ~~LOC~~ SOLN
40.0000 mg | SUBCUTANEOUS | Status: DC
  Administered 2019-10-10: 40 mg via SUBCUTANEOUS
  Filled 2019-10-10: qty 0.4

## 2019-10-10 MED ORDER — ONDANSETRON HCL 4 MG/2ML IJ SOLN
INTRAMUSCULAR | Status: DC | PRN
  Administered 2019-10-10: 4 mg via INTRAVENOUS

## 2019-10-10 NOTE — Op Note (Signed)
Operative Report  Dylan Hall 49 y.o. male  [id number]  [id number]  10/10/2019  Surgeon: Clovis Riley MD  Procedure performed: Laparoscopic Appendectomy  Preop diagnosis: Acute appendicitis  Post-op diagnosis/intraop findings: Acute appendicitis with localized purulent peritonitis  Specimens: appendix  EBL: minimal  Complications: none  Description of procedure: After obtaining informed consent the patient was brought to the operating room. Antibiotics were administered. SCD's were applied. General endotracheal anesthesia was initiated and a formal time-out was performed.  Foley catheter inserted which is removed at the end of the case.  The abdomen was prepped and draped in the usual sterile fashion and the abdomen was entered using an infraumbilical Veress needle and insufflated to 15 mmHg. A 5 mm trocar and camera were then introduced, the abdomen was inspected and there is no evidence of injury from our entry. A suprapubic 5 mm trocar and a left lower quadrant 12 mm trocar were introduced under direct visualization following infiltration with local. The patient was then placed in Trendelenburg and rotated to the left and the small bowel was reflected cephalad. The appendix was visualized: It is acutely inflamed and encased in firm edematous mesentery, and there was a small amount of purulent fluid entrapped between the appendix and the abdominal sidewall.  The base of the appendix was able to be identified entering the cecum and this was dissected out bluntly; great care was taken to ensure no injury to surrounding retroperitoneal structures, cecum or terminal ileum.  Once I was able to dissect a window through the mesoappendix at base of the appendix and a white load linear cutting stapler was used to transect the appendix from the cecum.  Harmonic scalpel was then used to transect the appendiceal mesentery. Hemostasis was ensured. The appendix was placed in an Endo Catch bag and  removed through our 12 mm trocar site.  The appendix did tear within the bag during extraction.  The specimen was inspected and confirmed to be appendix in at least 3 separate pieces.  The staple line was inspected and noted to be hemostatic and healthy appearing.  Purulent fluid was aspirated from the pelvis and the pelvis and right lower quadrant were irrigated and aspirated, the effluent was clear.  The omentum was brought down to cover the staple line.  The 5mm trocar site in the left lower quadrant was closed with a 0 vicryl in the fascia under direct visualization using a PMI device. The abdomen was desufflated and all trocars removed. The skin incisions were closed with subcuticular monocryl and Dermabond. The patient was awakened, extubated and transported to the recovery room in stable condition.   All counts were correct at the completion of the case.

## 2019-10-10 NOTE — Anesthesia Procedure Notes (Signed)
Procedure Name: Intubation Date/Time: 10/10/2019 12:00 AM Performed by: Clovis Cao, CRNA Pre-anesthesia Checklist: Patient identified, Emergency Drugs available, Suction available, Patient being monitored and Timeout performed Patient Re-evaluated:Patient Re-evaluated prior to induction Oxygen Delivery Method: Circle system utilized Preoxygenation: Pre-oxygenation with 100% oxygen Induction Type: IV induction, Rapid sequence and Cricoid Pressure applied Laryngoscope Size: Miller and 2 Grade View: Grade I Tube type: Oral Tube size: 7.5 mm Number of attempts: 1 Airway Equipment and Method: Stylet Placement Confirmation: ETT inserted through vocal cords under direct vision,  positive ETCO2 and breath sounds checked- equal and bilateral Secured at: 23 cm Tube secured with: Tape Dental Injury: Teeth and Oropharynx as per pre-operative assessment

## 2019-10-10 NOTE — Discharge Summary (Signed)
[facility] Discharge Summary   Patient ID: Dylan Hall MRN: [id number] DOB/AGE: 50-Aug-1972 50 y.o.  Admit date: 10/09/2019 Discharge date: 10/10/2019  Admitting Diagnosis: Acute appendicitis  Discharge Diagnosis Patient Active Problem List   Diagnosis Date Noted  . Acute appendicitis 10/10/2019  . Healthcare maintenance 01/29/2018  . Depression, recurrent ([place]) 08/08/2016  . Hyperlipidemia 08/08/2016  . Overweight on examination 08/08/2016  . Tobacco use disorder 08/08/2016  . Neck pain 08/08/2016  . Family history of diabetes mellitus in mother 08/08/2016    Consultants None  Imaging: CT ABDOMEN PELVIS W CONTRAST  Result Date: 10/09/2019 CLINICAL DATA:  Right lower abdominal pain, nausea EXAM: CT ABDOMEN AND PELVIS WITH CONTRAST TECHNIQUE: Multidetector CT imaging of the abdomen and pelvis was performed using the standard protocol following bolus administration of intravenous contrast. CONTRAST:  185mL OMNIPAQUE IOHEXOL 300 MG/ML  SOLN COMPARISON:  01/25/2007 FINDINGS: Lower chest: No acute pleural or parenchymal lung disease. Hepatobiliary: No focal liver abnormality is seen. No gallstones, gallbladder wall thickening, or biliary dilatation. Pancreas: Unremarkable. No pancreatic ductal dilatation or surrounding inflammatory changes. Spleen: Normal in size without focal abnormality. Adrenals/Urinary Tract: Adrenal glands are unremarkable. Kidneys are normal, without renal calculi, focal lesion, or hydronephrosis. Bladder is unremarkable. Stomach/Bowel: There is a dilated inflamed appendix in the right lower quadrant measuring 11 mm in diameter. Significant right lower quadrant periappendiceal fat stranding. No evidence of perforation, fluid collection, or abscess. No bowel obstruction or ileus. Vascular/Lymphatic: No significant vascular findings are present. No enlarged abdominal or pelvic lymph nodes. Reproductive: Prostate is unremarkable. Other: Significant fat  stranding right lower quadrant. Trace free fluid. No free gas. Musculoskeletal: No acute or destructive bony lesions. Small bone islands are seen within the left side pubic symphysis and right inferior pubic ramus. Reconstructed images demonstrate no additional findings. IMPRESSION: 1. Acute uncomplicated appendicitis.  No perforation or abscess. Electronically Signed   By: Randa Ngo M.D.   On: 10/09/2019 20:14    Procedures Dr. Kae Heller (10/10/2019) - Laparoscopic Appendectomy  Hospital Course:  Dylan Hall is a 50yo male who presented to [facility] 3/[facility] with 24 hours of nausea and abdominal pain that evolved to the RLQ.  Workup showed acute appendicitis.  Patient was admitted and underwent procedure listed above.  Tolerated procedure well and was transferred to the floor.  Diet was advanced as tolerated.  On POD0, the patient was voiding well, tolerating diet, ambulating well, pain well controlled, vital signs stable, incisions c/d/i and felt stable for discharge home.  Patient will follow up as below and knows to call with questions or concerns.    I have personally reviewed the patients medication history on the [place] controlled substance database.    Physical Exam: General:  Alert, NAD, pleasant, comfortable Pulm: rate and effort normal Abd:  Soft, ND, appropriately tender, multiple lap incisions C/D/I  Allergies as of 10/10/2019      Reactions   Codeine Itching      Medication List    STOP taking these medications   oxyCODONE 5 MG immediate release tablet Commonly known as: Oxy IR/ROXICODONE     TAKE these medications   acetaminophen 500 MG tablet Commonly known as: TYLENOL Take 2 tablets (1,000 mg total) by mouth every 6 (six) hours as needed for mild pain.   cyclobenzaprine 10 MG tablet Commonly known as: FLEXERIL Take 1 tablet (10 mg total) by mouth 3 (three) times daily as needed for muscle spasms.   DULoxetine 30 MG capsule Commonly known  as: CYMBALTA TAKE 1 CAPSULE BY  MOUTH EVERY DAY What changed:   how much to take  when to take this   fenofibrate 48 MG tablet Commonly known as: TRICOR TAKE 1 TABLET BY MOUTH DAILY What changed:   how much to take  how to take this  when to take this  additional instructions   ibuprofen 200 MG tablet Commonly known as: ADVIL Take 4 tablets (800 mg total) by mouth every 8 (eight) hours as needed for mild pain.   levocetirizine 5 MG tablet Commonly known as: Xyzal Take 1 tablet (5 mg total) by mouth at bedtime.   polyethylene glycol 17 g packet Commonly known as: MiraLax Take 17 g by mouth daily as needed for mild constipation.   rosuvastatin 20 MG tablet Commonly known as: CRESTOR TAKE 1 TABLET BY MOUTH DAILY. What changed:   how much to take  how to take this  when to take this  additional instructions   traMADol 50 MG tablet Commonly known as: ULTRAM Take 1 tablet (50 mg total) by mouth every 6 (six) hours as needed for severe pain.        Follow-up [facility], [place]. Call.   Specialty: General Surgery Why: We are working on your appointment, call to confirm. Please arrive 30 minutes prior to your appointment to check in and fill out paperwork. Bring photo ID and insurance information. Contact information: [address] [place] [phone]          Signed: Wellington Hampshire, [facility] 10/10/2019, 8:52 AM Please see Amion for pager number during day hours 7:00am-4:30pm

## 2019-10-10 NOTE — Transfer of Care (Signed)
Immediate Anesthesia Transfer of Care Note  Patient: Dylan Hall  Procedure(s) Performed: APPENDECTOMY LAPAROSCOPIC (N/A )  Patient Location: PACU  Anesthesia Type:General  Level of Consciousness: drowsy  Airway & Oxygen Therapy: Patient Spontanous Breathing and Patient connected to face mask oxygen  Post-op Assessment: Report given to RN and Post -op Vital signs reviewed and stable  Post vital signs: Reviewed and stable  Last Vitals:  Vitals Value Taken Time  BP 141/79 10/10/19 0103  Temp    Pulse 111 10/10/19 0103  Resp 27 10/10/19 0103  SpO2 99 % 10/10/19 0103  Vitals shown include unvalidated device data.  Last Pain:  Vitals:   10/09/19 2254  TempSrc:   PainSc: 2          Complications: No apparent anesthesia complications

## 2019-10-10 NOTE — Discharge Instructions (Signed)
[facility] ° °Please arrive at least 30 min before your appointment to complete your check in paperwork.  If you are unable to arrive 30 min prior to your appointment time we may have to cancel or reschedule you. °LAPAROSCOPIC SURGERY: POST OP INSTRUCTIONS °Always review your discharge instruction sheet given to you by the facility where your surgery was performed. °IF YOU HAVE DISABILITY OR FAMILY LEAVE FORMS, YOU MUST BRING THEM TO THE OFFICE FOR PROCESSING.   °DO NOT GIVE THEM TO YOUR DOCTOR. ° °PAIN CONTROL ° °1. First take acetaminophen (Tylenol) AND/or ibuprofen (Advil) to control your pain after surgery.  Follow directions on package.  Taking acetaminophen (Tylenol) and/or ibuprofen (Advil) regularly after surgery will help to control your pain and lower the amount of prescription pain medication you may need.  You should not take more than 4,000 mg (4 grams) of acetaminophen (Tylenol) in 24 hours.  You should not take ibuprofen (Advil), aleve, motrin, naprosyn or other NSAIDS if you have a history of stomach ulcers or chronic kidney disease.  °2. A prescription for pain medication may be given to you upon discharge.  Take your pain medication as prescribed, if you still have uncontrolled pain after taking acetaminophen (Tylenol) or ibuprofen (Advil). °3. Use ice packs to help control pain. °4. If you need a refill on your pain medication, please contact your pharmacy.  They will contact our office to request authorization. Prescriptions will not be filled after 5pm or on week-ends. ° °HOME MEDICATIONS °5. Take your usually prescribed medications unless otherwise directed. ° °DIET °6. You should follow a light diet the first few days after arrival home.  Be sure to include lots of fluids daily. Avoid fatty, fried foods.  ° °CONSTIPATION °7. It is common to experience some constipation after surgery and if you are taking pain medication.  Increasing fluid intake and taking a stool  softener (such as Colace) will usually help or prevent this problem from occurring.  A mild laxative (Milk of Magnesia or Miralax) should be taken according to package instructions if there are no bowel movements after 48 hours. ° °WOUND/INCISION CARE °8. Most patients will experience some swelling and bruising in the area of the incisions.  Ice packs will help.  Swelling and bruising can take several days to resolve.  °9. Unless discharge instructions indicate otherwise, follow guidelines below  °a. STERI-STRIPS - you may remove your outer bandages 48 hours after surgery, and you may shower at that time.  You have steri-strips (small skin tapes) in place directly over the incision.  These strips should be left on the skin for 7-10 days.   °b. DERMABOND/SKIN GLUE - you may shower in 24 hours.  The glue will flake off over the next 2-3 weeks. °10. Any sutures or staples will be removed at the office during your follow-up visit. ° °ACTIVITIES °11. You may resume regular (light) daily activities beginning the next day--such as daily self-care, walking, climbing stairs--gradually increasing activities as tolerated.  You may have sexual intercourse when it is comfortable.  Refrain from any heavy lifting or straining until approved by your doctor. °a. You may drive when you are no longer taking prescription pain medication, you can comfortably wear a seatbelt, and you can safely maneuver your car and apply brakes. ° °FOLLOW-UP °12. You should see your doctor in the office for a follow-up appointment approximately 2-3 weeks after your surgery.  You should have been given your post-op/follow-up appointment when   your surgery was scheduled.  If you did not receive a post-op/follow-up appointment, make sure that you call for this appointment within a day or two after you arrive home to insure a convenient appointment time. ° °OTHER INSTRUCTIONS ° °WHEN TO CALL YOUR DOCTOR: °1. Fever over 101.0 °2. Inability to  urinate °3. Continued bleeding from incision. °4. Increased pain, redness, or drainage from the incision. °5. Increasing abdominal pain ° °The clinic staff is available to answer your questions during regular business hours.  Please don’t hesitate to call and ask to speak to one of the nurses for clinical concerns.  If you have a medical emergency, go to the nearest emergency room or call 911.  A surgeon from [facility] is always on call at the hospital. °[address], [address], [place], [place]  [address] ? [address] [address], [place], [place]   [address] °[phone] ? [phone] ? FAX [phone] ° ° ° °

## 2019-10-11 LAB — SURGICAL PATHOLOGY

## 2019-10-13 NOTE — Anesthesia Postprocedure Evaluation (Signed)
Anesthesia Post Note  Patient: Dylan Hall  Procedure(s) Performed: APPENDECTOMY LAPAROSCOPIC (N/A Abdomen)     Patient location during evaluation: PACU Anesthesia Type: General Level of consciousness: sedated and patient cooperative Pain management: pain level controlled Vital Signs Assessment: post-procedure vital signs reviewed and stable Respiratory status: spontaneous breathing Cardiovascular status: stable Anesthetic complications: no    Last Vitals:  Vitals:   10/10/19 0635 10/10/19 1024  BP: 121/82 (!) 142/91  Pulse: 92 90  Resp: 18   Temp: 36.9 C 37.1 C  SpO2: 96% 97%    Last Pain:  Vitals:   10/10/19 1024  TempSrc: Oral  PainSc: 2                  Nolon Nations

## 2019-10-18 ENCOUNTER — Other Ambulatory Visit: Payer: Self-pay

## 2019-10-18 ENCOUNTER — Other Ambulatory Visit: Payer: 59

## 2019-10-18 DIAGNOSIS — Z0000 Encounter for general adult medical examination without abnormal findings: Secondary | ICD-10-CM

## 2019-10-18 DIAGNOSIS — Z833 Family history of diabetes mellitus: Secondary | ICD-10-CM

## 2019-10-18 DIAGNOSIS — E785 Hyperlipidemia, unspecified: Secondary | ICD-10-CM

## 2019-10-21 ENCOUNTER — Other Ambulatory Visit: Payer: Self-pay | Admitting: Physician Assistant

## 2019-10-21 DIAGNOSIS — Z0000 Encounter for general adult medical examination without abnormal findings: Secondary | ICD-10-CM

## 2019-10-21 DIAGNOSIS — E785 Hyperlipidemia, unspecified: Secondary | ICD-10-CM

## 2019-10-21 DIAGNOSIS — Z833 Family history of diabetes mellitus: Secondary | ICD-10-CM

## 2019-10-29 ENCOUNTER — Other Ambulatory Visit: Payer: Self-pay

## 2019-10-29 ENCOUNTER — Telehealth: Payer: Self-pay

## 2019-10-29 ENCOUNTER — Other Ambulatory Visit: Payer: 59

## 2019-10-29 DIAGNOSIS — Z0000 Encounter for general adult medical examination without abnormal findings: Secondary | ICD-10-CM

## 2019-10-29 DIAGNOSIS — E785 Hyperlipidemia, unspecified: Secondary | ICD-10-CM

## 2019-10-29 DIAGNOSIS — Z833 Family history of diabetes mellitus: Secondary | ICD-10-CM

## 2019-10-29 NOTE — Telephone Encounter (Signed)
Please advise if it is okay to add on this lab.  Charyl Bigger, CMA

## 2019-10-29 NOTE — Telephone Encounter (Signed)
Pt came into the office this morning to have labs drawn and is requesting to add on testosterone level.  Pt states that he is experiencing fatigue, difficulty maintaining erection, and premature ejaculation.  Please advise.  Charyl Bigger, CMA

## 2019-10-29 NOTE — Addendum Note (Signed)
Addended by: Fonnie Mu on: 10/29/2019 08:12 AM   Modules accepted: Orders

## 2019-10-29 NOTE — Telephone Encounter (Signed)
Prefer to discuss with patient at [place]. Symptoms he is experiencing can be due to various causes including medication side effects.  Thank you! Lorrene Reid, PA-C

## 2019-10-29 NOTE — Telephone Encounter (Signed)
This is a [place] pt that I only saw recently for an acute skin concern.  I rec asking Herb Grays about this since pt will be following with her.  I know nothing about any sx he has that would validate obtaining this lab.  THNX

## 2019-10-29 NOTE — Telephone Encounter (Signed)
10/29/2019  Advised pt that he would need telehealth visit with Lorrene Reid, PA to discuss these symptoms prior to ordering a testosterone level.  Offered pt appt, but he declined as he was upset that we could not simply add the lab orders and then have a discussion with Herb Grays regarding the results.  Advised pt to call back if he decides he would like to schedule an appt.  Charyl Bigger, CMA

## 2019-10-30 LAB — VITAMIN D 25 HYDROXY (VIT D DEFICIENCY, FRACTURES): Vit D, 25-Hydroxy: 21.9 ng/mL — ABNORMAL LOW (ref 30.0–100.0)

## 2019-10-30 LAB — HEMOGLOBIN A1C
Est. average glucose Bld gHb Est-mCnc: 111 mg/dL
Hgb A1c MFr Bld: 5.5 % (ref 4.8–5.6)

## 2019-10-30 LAB — LIPID PANEL
Chol/HDL Ratio: 5.5 ratio — ABNORMAL HIGH (ref 0.0–5.0)
Cholesterol, Total: 175 mg/dL (ref 100–199)
HDL: 32 mg/dL — ABNORMAL LOW (ref >39)
LDL Chol Calc (NIH): 110 mg/dL — ABNORMAL HIGH (ref 0–99)
Triglycerides: 189 mg/dL — ABNORMAL HIGH (ref 0–149)
VLDL Cholesterol Cal: 33 mg/dL (ref 5–40)

## 2019-10-30 LAB — CBC WITH DIFFERENTIAL/PLATELET
Basophils Absolute: 0.2 10*3/uL (ref 0.0–0.2)
Basos: 2 %
EOS (ABSOLUTE): 0.6 10*3/uL — ABNORMAL HIGH (ref 0.0–0.4)
Eos: 6 %
Hematocrit: 48.9 % (ref 37.5–51.0)
Hemoglobin: 17.4 g/dL (ref 13.0–17.7)
Immature Grans (Abs): 0 10*3/uL (ref 0.0–0.1)
Immature Granulocytes: 0 %
Lymphocytes Absolute: 3.2 10*3/uL — ABNORMAL HIGH (ref 0.7–3.1)
Lymphs: 36 %
MCH: 31.6 pg (ref 26.6–33.0)
MCHC: 35.6 g/dL (ref 31.5–35.7)
MCV: 89 fL (ref 79–97)
Monocytes Absolute: 0.7 10*3/uL (ref 0.1–0.9)
Monocytes: 7 %
Neutrophils Absolute: 4.4 10*3/uL (ref 1.4–7.0)
Neutrophils: 49 %
Platelets: 373 10*3/uL (ref 150–450)
RBC: 5.5 x10E6/uL (ref 4.14–5.80)
RDW: 13.3 % (ref 11.6–15.4)
WBC: 9 10*3/uL (ref 3.4–10.8)

## 2019-10-30 LAB — COMPREHENSIVE METABOLIC PANEL
ALT: 47 IU/L — ABNORMAL HIGH (ref 0–44)
AST: 26 IU/L (ref 0–40)
Albumin/Globulin Ratio: 1.8 (ref 1.2–2.2)
Albumin: 4.8 g/dL (ref 4.0–5.0)
Alkaline Phosphatase: 106 IU/L (ref 39–117)
BUN/Creatinine Ratio: 9 (ref 9–20)
BUN: 8 mg/dL (ref 6–24)
Bilirubin Total: 0.3 mg/dL (ref 0.0–1.2)
CO2: 21 mmol/L (ref 20–29)
Calcium: 9.9 mg/dL (ref 8.7–10.2)
Chloride: 104 mmol/L (ref 96–106)
Creatinine, Ser: 0.9 mg/dL (ref 0.76–1.27)
GFR calc Af Amer: 116 mL/min/{1.73_m2} (ref >59)
GFR calc non Af Amer: 100 mL/min/{1.73_m2} (ref >59)
Globulin, Total: 2.6 g/dL (ref 1.5–4.5)
Glucose: 103 mg/dL — ABNORMAL HIGH (ref 65–99)
Potassium: 4.5 mmol/L (ref 3.5–5.2)
Sodium: 139 mmol/L (ref 134–144)
Total Protein: 7.4 g/dL (ref 6.0–8.5)

## 2019-10-30 LAB — T4, FREE: Free T4: 1.03 ng/dL (ref 0.82–1.77)

## 2019-10-30 LAB — T3: T3, Total: 186 ng/dL — ABNORMAL HIGH (ref 71–180)

## 2019-10-30 LAB — TSH: TSH: 1.63 u[IU]/mL (ref 0.450–4.500)

## 2019-11-08 ENCOUNTER — Other Ambulatory Visit: Payer: Self-pay | Admitting: Adult Health

## 2019-12-04 ENCOUNTER — Telehealth: Payer: Self-pay | Admitting: Physician Assistant

## 2019-12-04 NOTE — Telephone Encounter (Signed)
Patient called after speaking with his pharmacy trying to refill his Crestor. He was only given a 30 day supply and was advised by pharm that he would need to contact PCP about additional refills. I advised patient that this seemed to be incorrect as in March 2021 PCP sent in 6 mnth supply of this med for the patient. I advised patient I would route a message to clinic staff to make sure order was placed correctly.

## 2019-12-05 NOTE — Telephone Encounter (Signed)
Spoke with Cristie Hem at [facility] who confirms that they have a prescription on file for Crestor that they have not yet filled.  Advised pt of this information and to contact pharmacy to fill RX on file.  Pt expressed understanding.  Charyl Bigger, CMA

## 2020-02-20 ENCOUNTER — Other Ambulatory Visit: Payer: Self-pay | Admitting: Family Medicine

## 2020-03-03 ENCOUNTER — Telehealth: Payer: Self-pay | Admitting: Physician Assistant

## 2020-03-03 MED ORDER — FENOFIBRATE 48 MG PO TABS: ORAL_TABLET | ORAL | 0 refills | Status: DC

## 2020-03-03 MED ORDER — LEVOCETIRIZINE DIHYDROCHLORIDE 5 MG PO TABS: 5.0000 mg | ORAL_TABLET | Freq: Every day | ORAL | 0 refills | Status: DC

## 2020-03-03 NOTE — Telephone Encounter (Signed)
Patient is requesting a refill of his fenofibrate and levocetirizine, iff approved please send to [facility] on [address]

## 2020-03-03 NOTE — Addendum Note (Signed)
Addended by: Mickel Crow on: 03/03/2020 08:56 AM   Modules accepted: Orders

## 2020-03-03 NOTE — Telephone Encounter (Signed)
#  60 day supply of refill sent to requested pharmacy.  Patient needs apt for further medication refills.   Please contact patient to schedule apt. AS, CMA

## 2020-03-30 ENCOUNTER — Telehealth: Payer: Self-pay | Admitting: Physician Assistant

## 2020-03-30 NOTE — Telephone Encounter (Signed)
Please call patient to schedule apt for Telehealth visit (next available) to discuss wishes for vaccine exemption. AS, CMA

## 2020-03-30 NOTE — Telephone Encounter (Signed)
Patient called in requesting information and a medical exemption from the covid vaccine for work due to family history and health issues and fears of blood clotting. Would like a medical exemption. Patient also sleep apnea.

## 2020-03-30 NOTE — Telephone Encounter (Signed)
Left voicemail about scheduling an appointment.

## 2020-04-09 ENCOUNTER — Telehealth: Payer: Self-pay | Admitting: Physician Assistant

## 2020-04-09 NOTE — Telephone Encounter (Signed)
Patient called in requesting a refill on crestor. [facility] [address]

## 2020-04-10 MED ORDER — ROSUVASTATIN CALCIUM 20 MG PO TABS: ORAL_TABLET | ORAL | 0 refills | Status: DC

## 2020-04-10 NOTE — Telephone Encounter (Signed)
60 day refill sent to requested pharmacy. AS< CMA

## 2020-04-10 NOTE — Addendum Note (Signed)
Addended by: Mickel Crow on: 04/10/2020 10:18 AM   Modules accepted: Orders

## 2020-04-21 ENCOUNTER — Telehealth: Payer: Self-pay | Admitting: Physician Assistant

## 2020-04-21 ENCOUNTER — Other Ambulatory Visit: Payer: Self-pay | Admitting: Physician Assistant

## 2020-04-21 MED ORDER — FENOFIBRATE 48 MG PO TABS: ORAL_TABLET | ORAL | 0 refills | Status: DC

## 2020-04-21 NOTE — Addendum Note (Signed)
Addended by: Mickel Crow on: 04/21/2020 10:52 AM   Modules accepted: Orders

## 2020-04-21 NOTE — Telephone Encounter (Signed)
30 day supply of med sent to pharmacy. AS< CMA

## 2020-04-21 NOTE — Telephone Encounter (Signed)
Patient is requesting a refill of his fenofibrate, if approved [facility] [address]

## 2020-04-26 IMAGING — CT CT ABD-PELV W/ CM
2 of 5 series · 16 of 46 positions shown, 18 images · IV contrast (omnipaque)
Comparison: 01/25/2007

CLINICAL DATA: Right lower abdominal pain, nausea

EXAM:
CT ABDOMEN AND PELVIS WITH CONTRAST
TECHNIQUE: Multidetector CT imaging of the abdomen and pelvis was performed
using the standard protocol following bolus administration of
intravenous contrast.
CONTRAST:  100mL OMNIPAQUE IOHEXOL 300 MG/ML  SOLN

[Series 3: abdomen 5.0 · axial · 0.90mm/px · z∈[+705,+1175]mm · 13 of 110 slices shown, 15 images]
[im 8/110  soft-tissue]
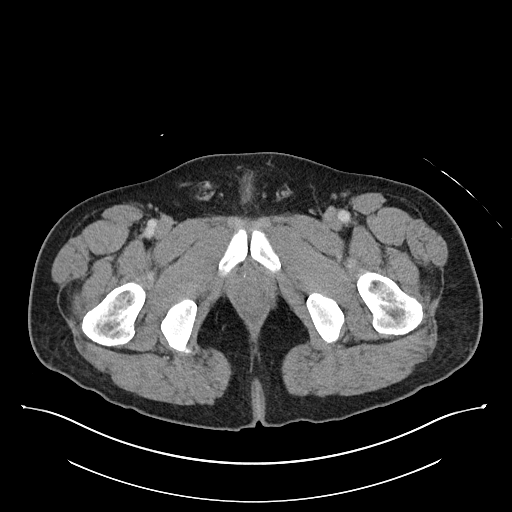
[im 8/110  bone]
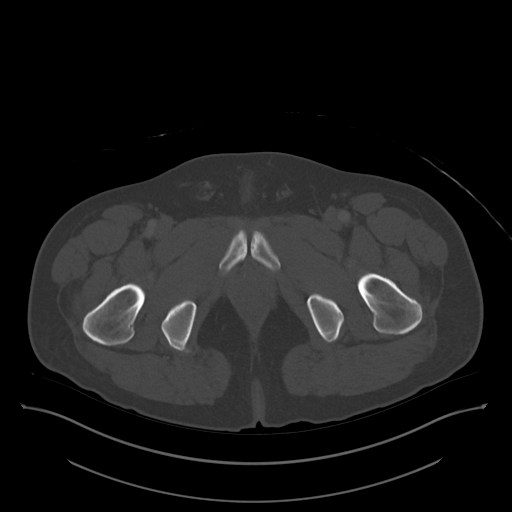
[im 15/110  soft-tissue]
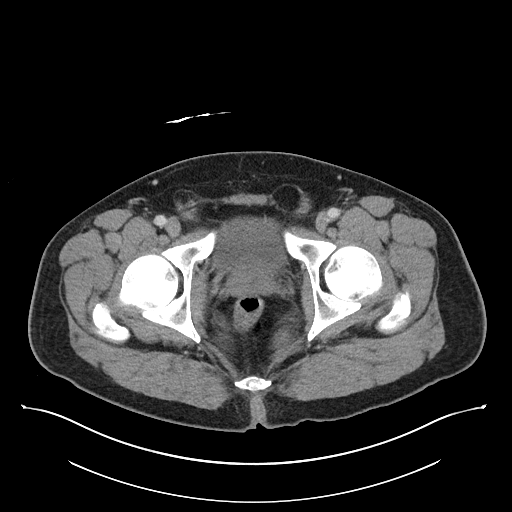
[im 22/110  soft-tissue]
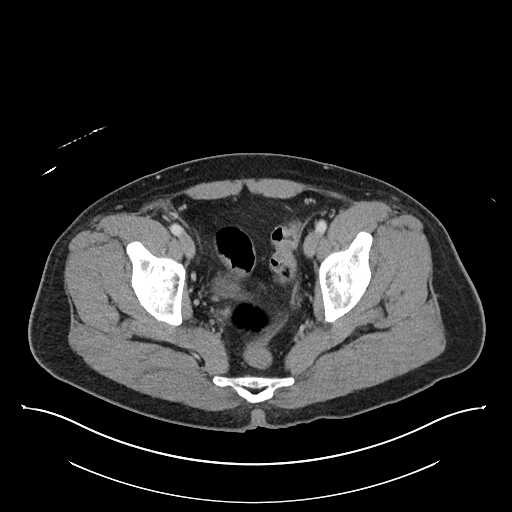
[im 30/110  soft-tissue]
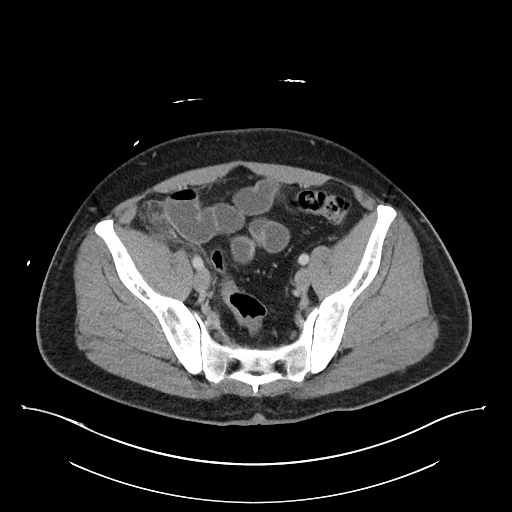
[im 37/110  soft-tissue]
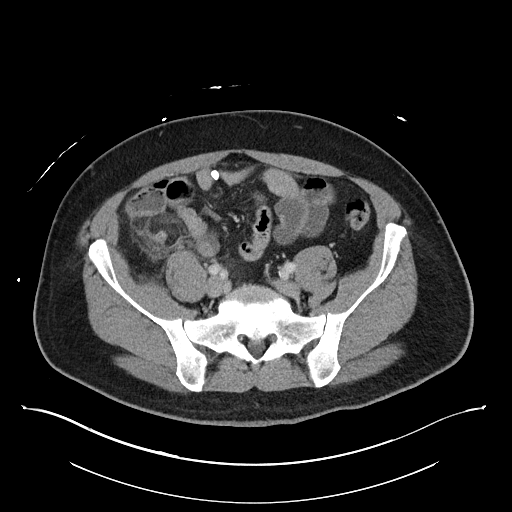
[im 44/110  soft-tissue]
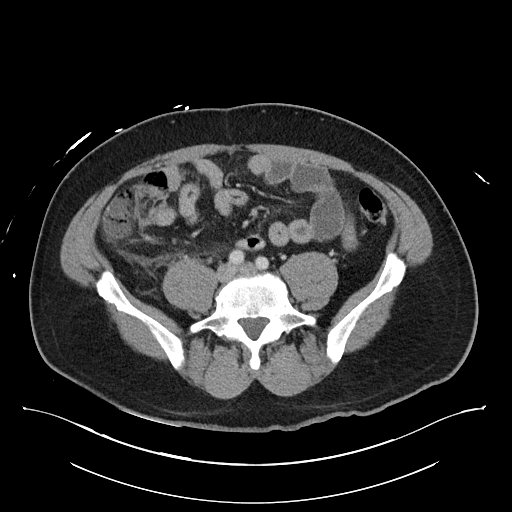
[im 59/110  soft-tissue]
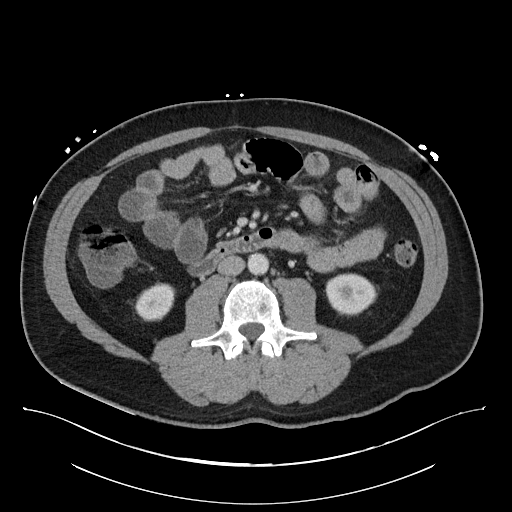
[im 66/110  soft-tissue]
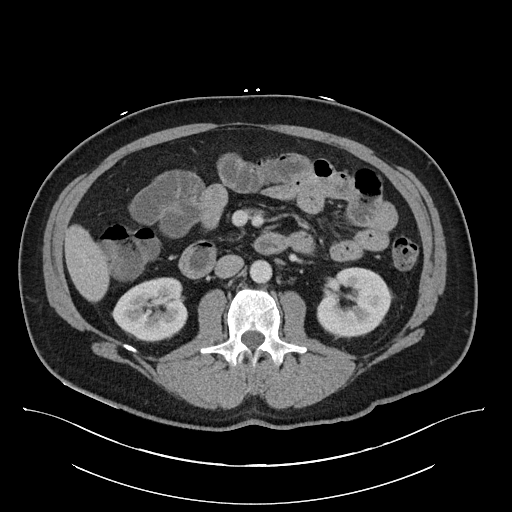
[im 73/110  soft-tissue]
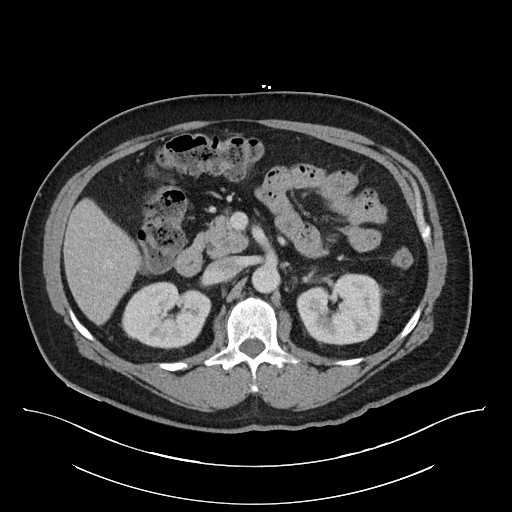
[im 73/110  bone]
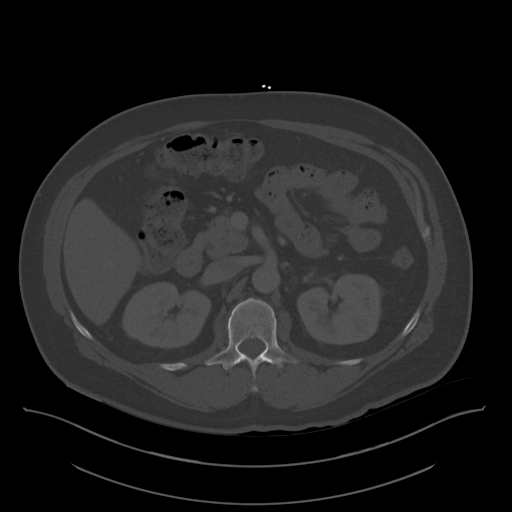
[im 80/110  soft-tissue]
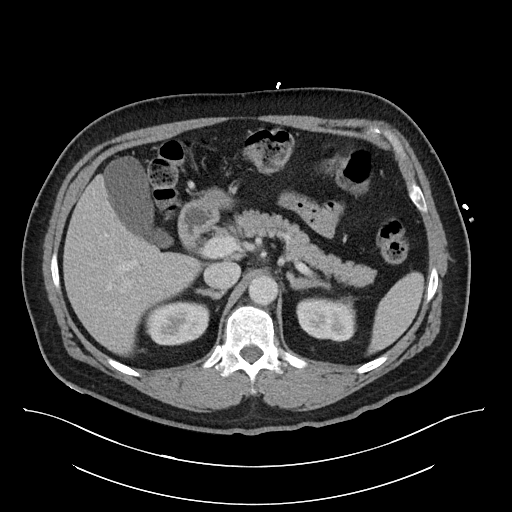
[im 88/110  soft-tissue]
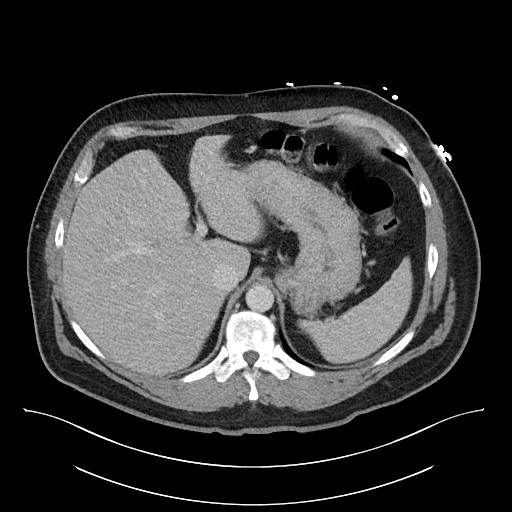
[im 95/110  soft-tissue]
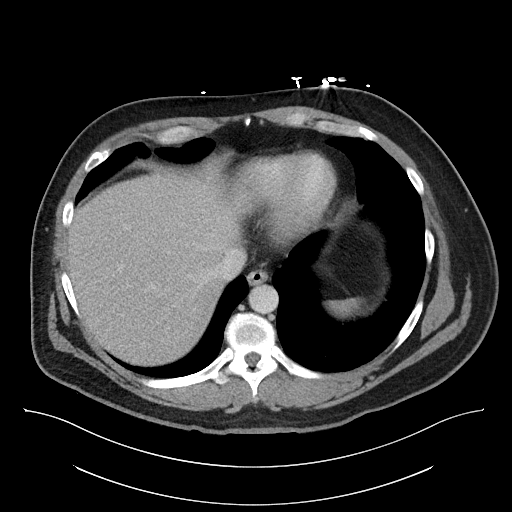
[im 102/110  soft-tissue]
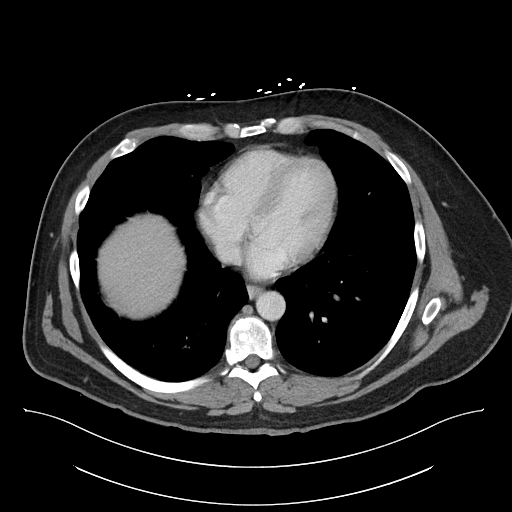

[Series 6: abdomen 3.0 mpr cor · coronal · 0.97mm/px · 3 of 107 slices shown]
[im 36/107  soft-tissue]
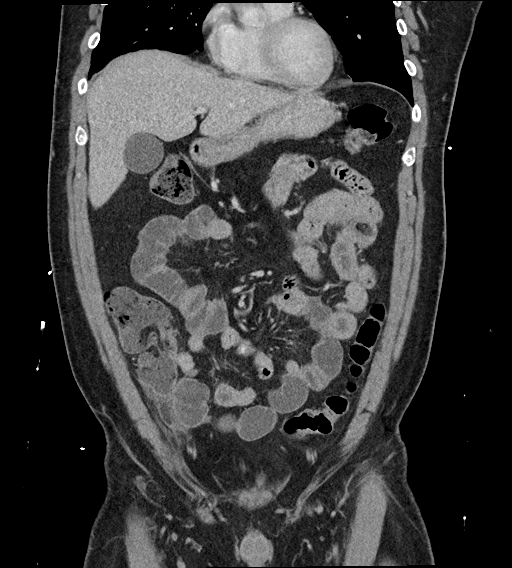
[im 48/107  soft-tissue]
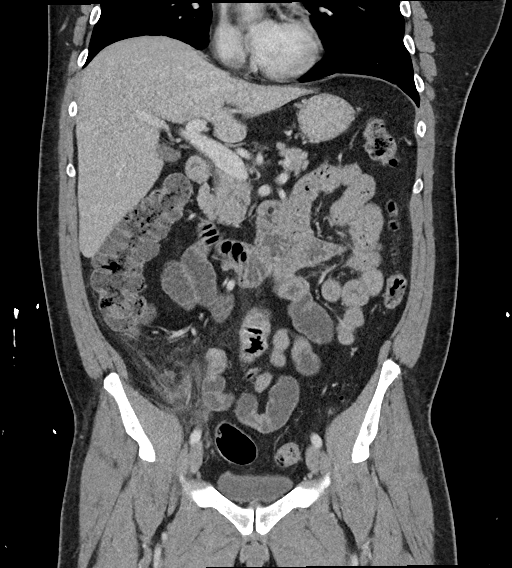
[im 59/107  soft-tissue]
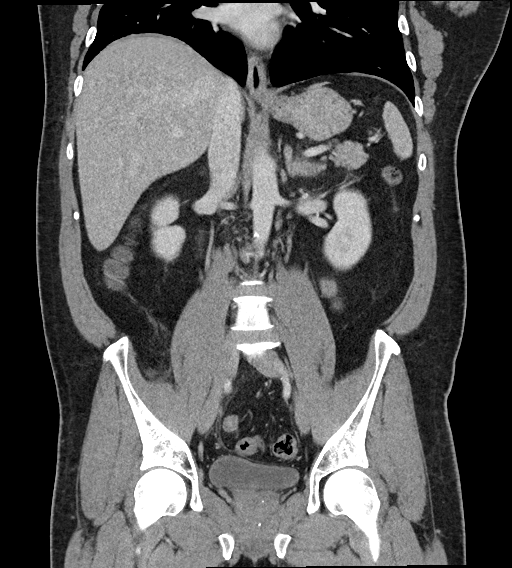

[16 of 46 positions shown; findings below may reference images not displayed]

FINDINGS: Lower chest: No acute pleural or parenchymal lung disease.

Hepatobiliary: No focal liver abnormality is seen. No gallstones,
gallbladder wall thickening, or biliary dilatation.

Pancreas: Unremarkable. No pancreatic ductal dilatation or
surrounding inflammatory changes.

Spleen: Normal in size without focal abnormality.

Adrenals/Urinary Tract: Adrenal glands are unremarkable. Kidneys are
normal, without renal calculi, focal lesion, or hydronephrosis.
Bladder is unremarkable.

Stomach/Bowel: There is a dilated inflamed appendix in the right
lower quadrant measuring 11 mm in diameter. Significant right lower
quadrant periappendiceal fat stranding. No evidence of perforation,
fluid collection, or abscess.

No bowel obstruction or ileus.

Vascular/Lymphatic: No significant vascular findings are present. No
enlarged abdominal or pelvic lymph nodes.

Reproductive: Prostate is unremarkable.

Other: Significant fat stranding right lower quadrant. Trace free
fluid. No free gas.

Musculoskeletal: No acute or destructive bony lesions. Small bone
islands are seen within the left side pubic symphysis and right
inferior pubic ramus. Reconstructed images demonstrate no additional
findings.
IMPRESSION: 1. Acute uncomplicated appendicitis.  No perforation or abscess.

## 2020-04-28 ENCOUNTER — Encounter: Payer: Self-pay | Admitting: Physician Assistant

## 2020-04-28 ENCOUNTER — Other Ambulatory Visit: Payer: Self-pay

## 2020-04-28 ENCOUNTER — Ambulatory Visit: Payer: 59 | Admitting: Physician Assistant

## 2020-04-28 VITALS — Temp 97.4°F | Ht 76.0 in | Wt 265.0 lb

## 2020-04-28 DIAGNOSIS — E785 Hyperlipidemia, unspecified: Secondary | ICD-10-CM

## 2020-04-28 DIAGNOSIS — F339 Major depressive disorder, recurrent, unspecified: Secondary | ICD-10-CM

## 2020-04-28 DIAGNOSIS — N529 Male erectile dysfunction, unspecified: Secondary | ICD-10-CM

## 2020-04-28 MED ORDER — ROSUVASTATIN CALCIUM 20 MG PO TABS: ORAL_TABLET | ORAL | 1 refills | Status: DC

## 2020-04-28 MED ORDER — FENOFIBRATE 48 MG PO TABS: ORAL_TABLET | ORAL | 1 refills | Status: DC

## 2020-04-28 MED ORDER — DULOXETINE HCL 30 MG PO CPEP: ORAL_CAPSULE | ORAL | 1 refills | Status: DC

## 2020-04-28 NOTE — Progress Notes (Signed)
Telehealth office visit note for Dylan Reid, PA-C- at Primary Care at [facility]   I connected with current patient today by telephone and verified that I am speaking with the correct person   . Location of the patient: Home . Location of the provider: Office - This visit type was conducted due to national recommendations for restrictions regarding the COVID-19 Pandemic (e.g. social distancing) in an effort to limit this patient's exposure and mitigate transmission in our community.    - No physical exam could be performed with this format, beyond that communicated to [place] by the patient/ family members as noted.   - Additionally my office staff/ schedulers were to discuss with the patient that there may be a monetary charge related to this service, depending on their medical insurance.  My understanding is that patient understood and consented to proceed.     _________________________________________________________________________________   History of Present Illness: Patient calls in to follow up on hyperlipidemia and mood. Also has complaints of having issues with shorter lasting erections. Also reports a weak stream and sometimes has nocturia. No history of low testosterone in the past and would like to check testosterone levels. Also requesting a medical exemption for COVID-19 vaccine. States has concerns about potential side effects, especially with family history of heart problems is concerned about blood clots.  HLD: Pt taking medication as directed without issues. Denies side effects. 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 Diet consist of vegetables and red meat.  Mood: Reports Cymbalta is working well. Denies SI/HI.     No flowsheet data found.  Depression screen [facility] 2/9 04/28/2020 08/12/2019 11/01/2018 01/29/2018 05/26/2017  Decreased Interest 0 0 0 0 1  Down, Depressed, Hopeless 0 0 0 1 1  PHQ - 2 Score 0 0 0 1 2  Altered sleeping 1 0 0 0 2   Tired, decreased energy 1 1 1  0 1  Change in appetite 3 0 3 0 2  Feeling bad or failure about yourself  0 0 0 0 0  Trouble concentrating 0 0 0 0 0  Moving slowly or fidgety/restless 0 0 0 0 0  Suicidal thoughts 0 0 0 0 0  PHQ-9 Score 5 1 4 1 7   Difficult doing work/chores Not difficult at all Not difficult at all Not difficult at all Not difficult at all Not difficult at all      Impression and Recommendations:     1. Hyperlipidemia, unspecified hyperlipidemia type   2. Depression, recurrent ([place])   3. Erectile dysfunction, unspecified erectile dysfunction type     Hyperlipidemia: -Last lipid panel: Total cholesterol 175, triglycerides 189, HDL 32, LDL 110 -Continue current medication regimen. -Recommend to reduce saturated and trans fats and follow a heart healthy diet. -Advised to schedule lab visit for fasting blood work to repeat lipid panel and hepatic function. -Will continue to monitor.  Recurrent depression: -PHQ 2 score of 0 -Continue current medication regimen. -Will continue to monitor.  ED: -Discussed with patient to schedule lab visit for fasting blood work including testosterone, PSA, HgA1c and TSH to evaluate for possible etiologies. -Uncontrolled hyperlipidemia is possibly contributing to symptoms.  COVID-19 vaccine exemption: -Thoroughly reviewed patient's chart and there is no documentation of severe allergic reactions to immunizations, patient has no history of bleeding disorders, or thromboembolic disease to consider medical exemption.  -Recommend inquiring about religious exemption.     - As part of my medical decision making,  I reviewed the following data within the [place] History obtained from pt /family, CMA notes reviewed and incorporated if applicable, Labs reviewed, Radiograph/ tests reviewed if applicable and OV notes from prior OV's with me, as well as any other specialists she/he has seen since seeing me last, were all  reviewed and used in my medical decision making process today.    - Additionally, when appropriate, discussion had with patient regarding our treatment plan, and their biases/concerns about that plan were used in my medical decision making today.    - The patient agreed with the plan and demonstrated an understanding of the instructions.   No barriers to understanding were identified.     - The patient was advised to call back or seek an in-person evaluation if the symptoms worsen or if the condition fails to improve as anticipated.   Return for CPE in 4-5 months; lab visit for FBW inlcuding testosterone free/tota, psa in 1-5 weeks.    No orders of the defined types were placed in this encounter.   Meds ordered this encounter  Medications  . fenofibrate (TRICOR) 48 MG tablet    Sig: TAKE 1 TABLET BY MOUTH DAILY    Dispense:  90 tablet    Refill:  1    Order Specific Question:   Supervising Provider    Answer:   Beatrice Lecher D [[id number]]  . DULoxetine (CYMBALTA) 30 MG capsule    Sig: TAKE 1 CAPSULE BY MOUTH EVERY DAY    Dispense:  90 capsule    Refill:  1    Order Specific Question:   Supervising Provider    Answer:   Beatrice Lecher D [[id number]]  . rosuvastatin (CRESTOR) 20 MG tablet    Sig: TAKE 1 TABLET BY MOUTH DAILY.**PATIENT NEEDS APT FOR FURTHER REFILLS**    Dispense:  90 tablet    Refill:  1    Order Specific Question:   Supervising Provider    Answer:   Beatrice Lecher D [[id number]]    Medications Discontinued During This Encounter  Medication Reason  . levocetirizine (XYZAL) 5 MG tablet Ineffective  . polyethylene glycol (MIRALAX) 17 g packet   . traMADol (ULTRAM) 50 MG tablet   . DULoxetine (CYMBALTA) 30 MG capsule Reorder  . rosuvastatin (CRESTOR) 20 MG tablet Reorder  . fenofibrate (TRICOR) 48 MG tablet Reorder       Time spent on visit including pre-visit chart review and post-visit care was 22 minutes.      The [place] was signed  into law in 2016 which includes the topic of electronic health records.  This provides immediate access to information in MyChart.  This includes consultation notes, operative notes, office notes, lab results and pathology reports.  If you have any questions about what you read please let us know at your next visit or call us at the office.  We are right here with you.  Note:  This note was prepared with assistance of Dragon voice recognition software. Occasional wrong-word or sound-a-like substitutions may have occurred due to the inherent limitations of voice recognition software.  __________________________________________________________________________________     Patient Care Team    Relationship Specialty Notifications Start End  Dylan Hall, [place] PCP - General Physician Assistant  10/29/19   Mcarthur Rossetti, MD Consulting Physician Orthopedic Surgery  01/29/19      -Vitals obtained; medications/ allergies reconciled;  personal medical, social, Sx etc.histories were updated by CMA, reviewed by me and are  reflected in chart   Patient Active Problem List   Diagnosis Date Noted  . Acute appendicitis 10/10/2019  . Healthcare maintenance 01/29/2018  . Depression, recurrent ([place]) 08/08/2016  . Hyperlipidemia 08/08/2016  . Overweight on examination 08/08/2016  . Tobacco use disorder 08/08/2016  . Neck pain 08/08/2016  . Family history of diabetes mellitus in mother 08/08/2016     Current Meds  Medication Sig  . acetaminophen (TYLENOL) 500 MG tablet Take 2 tablets (1,000 mg total) by mouth every 6 (six) hours as needed for mild pain.  . cetirizine (ZYRTEC) 5 MG tablet Take 5 mg by mouth daily.  . cyclobenzaprine (FLEXERIL) 10 MG tablet Take 1 tablet (10 mg total) by mouth 3 (three) times daily as needed for muscle spasms.  . DULoxetine (CYMBALTA) 30 MG capsule TAKE 1 CAPSULE BY MOUTH EVERY DAY  . fenofibrate (TRICOR) 48 MG tablet TAKE 1 TABLET BY MOUTH DAILY  .  ibuprofen (ADVIL) 200 MG tablet Take 4 tablets (800 mg total) by mouth every 8 (eight) hours as needed for mild pain.  . rosuvastatin (CRESTOR) 20 MG tablet TAKE 1 TABLET BY MOUTH DAILY.**PATIENT NEEDS APT FOR FURTHER REFILLS**  . [DISCONTINUED] DULoxetine (CYMBALTA) 30 MG capsule TAKE 1 CAPSULE BY MOUTH EVERY DAY  . [DISCONTINUED] fenofibrate (TRICOR) 48 MG tablet TAKE 1 TABLET BY MOUTH DAILY**PATIENT NEEDS APT FOR FURTHER REFILLS**  . [DISCONTINUED] rosuvastatin (CRESTOR) 20 MG tablet TAKE 1 TABLET BY MOUTH DAILY.**PATIENT NEEDS APT FOR FURTHER REFILLS**     Allergies:  Allergies  Allergen Reactions  . Codeine Itching     ROS:  See above HPI for pertinent positives and negatives   Objective:   Temperature (!) 97.4 F (36.3 C), temperature source Temporal, height 6\' 4"  (1.93 m), weight 265 lb (120.2 kg).  (if some vitals are omitted, this means that patient was UNABLE to obtain them even though they were asked to get them prior to OV today.  They were asked to call us at their earliest convenience with these once obtained. ) General: A & O * 3; sounds in no acute distress; in usual state of health.  Respiratory: speaking in full sentences, no conversational dyspnea Psych: insight appears good, mood- appears full

## 2020-05-04 ENCOUNTER — Telehealth: Payer: Self-pay | Admitting: Physician Assistant

## 2020-05-04 NOTE — Telephone Encounter (Signed)
-----   Message from Lorrene Reid, [place] sent at 05/01/2020  2:02 PM EDT ----- Please call Mr. Dylan Hall and notify I have thoroughly reviewed his chart, and unfortunately I am not able to provide a medical exemption for Covid-19 vaccine. There is no history of severe allergic reaction to immunizations, no history of bleeding disorder or thromboembolic disease to have enough information to support an exemption. I recommend inquiring about religious exemption to his employer.  Thank you, Herb Grays

## 2020-05-04 NOTE — Telephone Encounter (Signed)
Pt informed.  Pt expressed understanding.  T. Leata Dominy, CMA 

## 2020-05-04 NOTE — Telephone Encounter (Signed)
I do not see any documentation from this visit re: allergies.  Please review.  Charyl Bigger, CMA

## 2020-05-04 NOTE — Telephone Encounter (Signed)
Patient wants to know if a new allergy medication was called in by change. He was seen virtually last week.

## 2020-05-05 ENCOUNTER — Telehealth: Payer: Self-pay | Admitting: Physician Assistant

## 2020-05-05 DIAGNOSIS — J3089 Other allergic rhinitis: Secondary | ICD-10-CM

## 2020-05-05 MED ORDER — FEXOFENADINE HCL 180 MG PO TABS: 180.0000 mg | ORAL_TABLET | Freq: Every day | ORAL | 1 refills | Status: AC

## 2020-05-05 NOTE — Telephone Encounter (Signed)
Other concerns were addressed at patient's telemedicine visit last week and do not recall discussing allergies. Xyzal is on patient's medication list by historical provider. Please inquire if patient is currently taking any allergy medication.  Thank you, Herb Grays

## 2020-05-05 NOTE — Telephone Encounter (Signed)
Pt states that he has previously tried Zyrtec.  He is currently taking Allegra, which helps but he would like a prescription for 90 day supply for insurance purposes.  If agreeable please send RX to pharmacy.  Charyl Bigger, CMA

## 2020-05-05 NOTE — Telephone Encounter (Signed)
Pt informed.  T. Gotham Raden, CMA 

## 2020-11-05 ENCOUNTER — Telehealth: Payer: Self-pay | Admitting: Physician Assistant

## 2020-11-05 ENCOUNTER — Other Ambulatory Visit: Payer: Self-pay | Admitting: Physician Assistant

## 2020-11-05 DIAGNOSIS — F339 Major depressive disorder, recurrent, unspecified: Secondary | ICD-10-CM

## 2020-11-05 DIAGNOSIS — E785 Hyperlipidemia, unspecified: Secondary | ICD-10-CM

## 2020-11-05 NOTE — Telephone Encounter (Signed)
Please contact patient to schedule per last AVS for further medication refills. AS, CMA 

## 2020-11-05 NOTE — Telephone Encounter (Signed)
Left voicemail for patient

## 2021-01-06 ENCOUNTER — Other Ambulatory Visit: Payer: Self-pay | Admitting: Physician Assistant

## 2021-01-06 ENCOUNTER — Telehealth: Payer: Self-pay | Admitting: Physician Assistant

## 2021-01-06 DIAGNOSIS — F339 Major depressive disorder, recurrent, unspecified: Secondary | ICD-10-CM

## 2021-01-06 DIAGNOSIS — E785 Hyperlipidemia, unspecified: Secondary | ICD-10-CM

## 2021-01-06 NOTE — Telephone Encounter (Signed)
Please contact patient to schedule for apt per last AVS for further medication refills. AS, CMA

## 2021-05-06 ENCOUNTER — Encounter: Payer: Self-pay | Admitting: Gastroenterology

## 2021-06-25 ENCOUNTER — Ambulatory Visit (AMBULATORY_SURGERY_CENTER): Payer: 59

## 2021-06-25 ENCOUNTER — Other Ambulatory Visit: Payer: Self-pay

## 2021-06-25 ENCOUNTER — Encounter: Payer: Self-pay | Admitting: Gastroenterology

## 2021-06-25 VITALS — Ht 76.0 in | Wt 276.0 lb

## 2021-06-25 DIAGNOSIS — Z1211 Encounter for screening for malignant neoplasm of colon: Secondary | ICD-10-CM

## 2021-06-25 MED ORDER — NA SULFATE-K SULFATE-MG SULF 17.5-3.13-1.6 GM/177ML PO SOLN: 1.0000 | Freq: Once | ORAL | 0 refills | Status: AC

## 2021-06-25 NOTE — Progress Notes (Signed)
° ° °  Patient's pre-visit was done today over the phone with the patient   Name,DOB and address verified.   Patient denies any allergies to Eggs and Soy.  Patient denies any problems with anesthesia/sedation. Patient denies taking diet pills or blood thinners.  Denies atrial flutter or atrial fib Denies chronic constipation No home Oxygen.   Packet of Prep instructions mailed to patient including a copy of a consent form-pt is aware.  Patient understands to call us back with any questions or concerns.  Patient is aware of our care-partner policy and AFOAD-52 safety protocol.     Pt requested prep instructions emailed to him at [email or web address]  Pt does not use My Chart.  Hard copy also mailed.

## 2021-07-09 ENCOUNTER — Encounter: Payer: Self-pay | Admitting: Gastroenterology

## 2021-07-09 ENCOUNTER — Ambulatory Visit (AMBULATORY_SURGERY_CENTER): Payer: 59 | Admitting: Gastroenterology

## 2021-07-09 VITALS — BP 115/64 | HR 64 | Temp 98.9°F | Resp 17 | Ht 76.0 in | Wt 276.0 lb

## 2021-07-09 DIAGNOSIS — K635 Polyp of colon: Secondary | ICD-10-CM | POA: Diagnosis not present

## 2021-07-09 DIAGNOSIS — Z1211 Encounter for screening for malignant neoplasm of colon: Secondary | ICD-10-CM | POA: Diagnosis not present

## 2021-07-09 DIAGNOSIS — K621 Rectal polyp: Secondary | ICD-10-CM | POA: Diagnosis not present

## 2021-07-09 DIAGNOSIS — D125 Benign neoplasm of sigmoid colon: Secondary | ICD-10-CM

## 2021-07-09 DIAGNOSIS — D127 Benign neoplasm of rectosigmoid junction: Secondary | ICD-10-CM | POA: Diagnosis not present

## 2021-07-09 DIAGNOSIS — D129 Benign neoplasm of anus and anal canal: Secondary | ICD-10-CM

## 2021-07-09 MED ORDER — SODIUM CHLORIDE 0.9 % IV SOLN: 500.0000 mL | Freq: Once | INTRAVENOUS | Status: DC

## 2021-07-09 NOTE — Progress Notes (Signed)
SM VS

## 2021-07-09 NOTE — Progress Notes (Signed)
[facility] History and Physical   Primary Care Physician:  Lorrene Reid, PA-C   Reason for Procedure:   Colon cancer screening  Plan:    Screening colonoscopy     HPI: Dylan Hall is a 50 y.o. male undergoing initial average risk screening colonoscopy.  He has no family history of colon cancer and no chronic GI symptoms.    Past Medical History:  Diagnosis Date   Anxiety    Depression    Hyperlipidemia    Sleep apnea    apap    Past Surgical History:  Procedure Laterality Date   APPENDECTOMY     FINGER AMPUTATION Left 01/2004   index   LAPAROSCOPIC APPENDECTOMY N/A 10/09/2019   Procedure: APPENDECTOMY LAPAROSCOPIC;  Surgeon: Clovis Riley, MD;  Location: [facility];  Service: General;  Laterality: N/A;   ROTATOR CUFF REPAIR      Prior to Admission medications   Medication Sig Start Date End Date Taking? Authorizing Provider  DULoxetine (CYMBALTA) 30 MG capsule **NEEDS APT FOR REFILLS** TAKE 1 CAPSULE BY MOUTH EVERY DAY 01/06/21  Yes Abonza, Maritza, PA-C  fenofibrate (TRICOR) 48 MG tablet **NEED APT FOR REFILLS**TAKE 1 TABLET BY MOUTH EVERY DAY 01/06/21  Yes Abonza, Maritza, PA-C  ibuprofen (ADVIL) 200 MG tablet Take 4 tablets (800 mg total) by mouth every 8 (eight) hours as needed for mild pain. 10/10/19  Yes Meuth, Brooke A, PA-C  rosuvastatin (CRESTOR) 20 MG tablet TAKE 1 TABLET BY MOUTH DAILY.**PATIENT NEEDS APT FOR FURTHER REFILLS** 01/06/21  Yes Abonza, Maritza, PA-C  acetaminophen (TYLENOL) 500 MG tablet Take 2 tablets (1,000 mg total) by mouth every 6 (six) hours as needed for mild pain. 10/10/19   Meuth, Brooke A, PA-C  cyclobenzaprine (FLEXERIL) 10 MG tablet Take 1 tablet (10 mg total) by mouth 3 (three) times daily as needed for muscle spasms. Patient not taking: Reported on 06/25/2021 02/14/17   Magnus Sinning, MD  fexofenadine [facility] ALLERGY) 180 MG tablet Take 1 tablet (180 mg total) by mouth daily. 05/05/20   Lorrene Reid, PA-C  LORATADINE  ALLERGY RELIEF PO Take 10 mg by mouth at bedtime.  10/01/19  [provider]    Current Outpatient Medications  Medication Sig Dispense Refill   DULoxetine (CYMBALTA) 30 MG capsule **NEEDS APT FOR REFILLS** TAKE 1 CAPSULE BY MOUTH EVERY DAY 15 capsule 0   fenofibrate (TRICOR) 48 MG tablet **NEED APT FOR REFILLS**TAKE 1 TABLET BY MOUTH EVERY DAY 15 tablet 0   ibuprofen (ADVIL) 200 MG tablet Take 4 tablets (800 mg total) by mouth every 8 (eight) hours as needed for mild pain.     rosuvastatin (CRESTOR) 20 MG tablet TAKE 1 TABLET BY MOUTH DAILY.**PATIENT NEEDS APT FOR FURTHER REFILLS** 15 tablet 0   acetaminophen (TYLENOL) 500 MG tablet Take 2 tablets (1,000 mg total) by mouth every 6 (six) hours as needed for mild pain. 30 tablet 0   cyclobenzaprine (FLEXERIL) 10 MG tablet Take 1 tablet (10 mg total) by mouth 3 (three) times daily as needed for muscle spasms. (Patient not taking: Reported on 06/25/2021) 90 tablet 0   fexofenadine (ALLEGRA ALLERGY) 180 MG tablet Take 1 tablet (180 mg total) by mouth daily. 90 tablet 1   Current Facility-Administered Medications  Medication Dose Route Frequency Provider Last Rate Last Admin   0.9 %  sodium chloride infusion  500 mL Intravenous Once Daryel November, MD        Allergies as of 07/09/2021 - Review Complete 07/09/2021  Allergen Reaction Noted   Codeine Itching 08/08/2016    Family History  Problem Relation Age of Onset   Colon polyps Mother    Diabetes Mother    Heart disease Mother    Heart attack Mother    Dementia Mother    Alcohol abuse Father    Colon polyps Maternal Aunt    Hypertension Maternal Aunt    Dementia Maternal Aunt    Colon polyps Maternal Uncle    Alcohol abuse Maternal Uncle    Heart attack Maternal Uncle    Hypertension Maternal Uncle    Dementia Maternal Uncle    Alcohol abuse Paternal Uncle    Colon cancer Neg Hx    Esophageal cancer Neg Hx    Rectal cancer Neg Hx    Stomach cancer Neg Hx      Social History   Socioeconomic History   Marital status: Married    Spouse name: Not on file   Number of children: Not on file   Years of education: Not on file   Highest education level: Not on file  Occupational History   Not on file  Tobacco Use   Smoking status: Every Day    Packs/day: 1.00    Years: 26.00    Pack years: 26.00    Types: Cigarettes   Smokeless tobacco: Former    Types: Snuff  Vaping Use   Vaping Use: Never used  Substance and Sexual Activity   Alcohol use: Yes    Alcohol/week: 2.0 standard drinks    Types: 2 Cans of beer per week   Drug use: No   Sexual activity: Yes    Birth control/protection: None  Other Topics Concern   Not on file  Social History Narrative   Not on file   Social Determinants of Health   Financial Resource Strain: Not on file  Food Insecurity: Not on file  Transportation Needs: Not on file  Physical Activity: Not on file  Stress: Not on file  Social Connections: Not on file  Intimate Partner Violence: Not on file    Review of Systems:  All other review of systems negative except as mentioned in the HPI.  Physical Exam: Vital signs BP (!) 147/106    Pulse 83    Temp 98.9 F (37.2 C)    Ht 6\' 4"  (1.93 m)    Wt 276 lb (125.2 kg)    SpO2 96%    BMI 33.60 kg/m   General:   Alert,  Well-developed, well-nourished, pleasant and cooperative in NAD Airway:  Mallampati 1 Lungs:  Clear throughout to auscultation.   Heart:  Regular rate and rhythm; no murmurs, clicks, rubs,  or gallops. Abdomen:  Soft, nontender and nondistended. Normal bowel sounds.   Neuro/Psych:  Normal mood and affect. A and O x 3   Lilinoe Acklin E. Candis Schatz, MD [facility]

## 2021-07-09 NOTE — Patient Instructions (Addendum)
Read all of the handouts given to you  by your recover room nurse. You will need a repeat colonoscopy in 3 years due to your prep.  YOU HAD AN ENDOSCOPIC PROCEDURE TODAY AT [facility]:   Refer to the procedure report that was given to you for any specific questions about what was found during the examination.  If the procedure report does not answer your questions, please call your gastroenterologist to clarify.  If you requested that your care partner not be given the details of your procedure findings, then the procedure report has been included in a sealed envelope for you to review at your convenience later.  YOU SHOULD EXPECT: Some feelings of bloating in the abdomen. Passage of more gas than usual.  Walking can help get rid of the air that was put into your GI tract during the procedure and reduce the bloating. If you had a lower endoscopy (such as a colonoscopy or flexible sigmoidoscopy) you may notice spotting of blood in your stool or on the toilet paper. If you underwent a bowel prep for your procedure, you may not have a normal bowel movement for a few days.  Please Note:  You might notice some irritation and congestion in your nose or some drainage.  This is from the oxygen used during your procedure.  There is no need for concern and it should clear up in a day or so.  SYMPTOMS TO REPORT IMMEDIATELY:  Following lower endoscopy (colonoscopy or flexible sigmoidoscopy):  Excessive amounts of blood in the stool  Significant tenderness or worsening of abdominal pains  Swelling of the abdomen that is new, acute  Fever of 100F or higher   For urgent or emergent issues, a gastroenterologist can be reached at any hour by calling [phone]. Do not use MyChart messaging for urgent concerns.    DIET:  We do recommend a small meal initially.  Eat a high fiber diet, and drink plenty of water.  ACTIVITY:  You should plan to take it easy for the rest of today and you  should NOT DRIVE or use heavy machinery until tomorrow (because of the sedation medicines used during the test).    FOLLOW UP: Our staff will call the number listed on your records 48-72 hours following your procedure to check on you and address any questions or concerns that you may have regarding the information given to you following your procedure. If we do not reach you, we will leave a message.  We will attempt to reach you two times.  During this call, we will ask if you have developed any symptoms of COVID 19. If you develop any symptoms (ie: fever, flu-like symptoms, shortness of breath, cough etc.) before then, please call [phone].  If you test positive for Covid 19 in the 2 weeks post procedure, please call and report this information to [place].    If any biopsies were taken you will be contacted by phone or by letter within the next 1-3 weeks.  Please call us at [phone] if you have not heard about the biopsies in 3 weeks.    SIGNATURES/CONFIDENTIALITY: You and/or your care partner have signed paperwork which will be entered into your electronic medical record.  These signatures attest to the fact that that the information above on your After Visit Summary has been reviewed and is understood.  Full responsibility of the confidentiality of this discharge information lies with you and/or your care-partner.

## 2021-07-09 NOTE — Op Note (Signed)
[place] Patient Name: Dylan Hall Near Procedure Date: 07/09/2021 9:55 AM MRN: [id number] Endoscopist: Nicki Reaper E. Candis Schatz , MD Age: 50 Referring MD:  Date of Birth: 11-04-1970 Gender: Male Account #: [id number] Procedure:                Colonoscopy Indications:              Screening for colorectal malignant neoplasm, This                            is the patient's first colonoscopy Medicines:                Monitored Anesthesia Care Procedure:                Pre-Anesthesia Assessment:                           - Prior to the procedure, a History and Physical                            was performed, and patient medications and                            allergies were reviewed. The patient's tolerance of                            previous anesthesia was also reviewed. The risks                            and benefits of the procedure and the sedation                            options and risks were discussed with the patient.                            All questions were answered, and informed consent                            was obtained. Prior Anticoagulants: The patient has                            taken no previous anticoagulant or antiplatelet                            agents. ASA Grade Assessment: II - A patient with                            mild systemic disease. After reviewing the risks                            and benefits, the patient was deemed in                            satisfactory condition to undergo the procedure.  After obtaining informed consent, the colonoscope                            was passed under direct vision. Throughout the                            procedure, the patient's blood pressure, pulse, and                            oxygen saturations were monitored continuously. The                            Olympus CF-HQ190L [id number]) Colonoscope was                            introduced through the  anus and advanced to the the                            terminal ileum, with identification of the                            appendiceal orifice and IC valve. The colonoscopy                            was performed without difficulty. The patient                            tolerated the procedure well. The quality of the                            bowel preparation was fair. The terminal ileum,                            ileocecal valve, appendiceal orifice, and rectum                            were photographed. Scope In: 10:12:13 AM Scope Out: 10:37:47 AM Scope Withdrawal Time: 0 hours 20 minutes 15 seconds  Total Procedure Duration: 0 hours 25 minutes 34 seconds  Findings:                 The perianal and digital rectal examinations were                            normal. Pertinent negatives include normal                            sphincter tone and no palpable rectal lesions.                           A 4 mm polyp was found in the sigmoid colon. The                            polyp was sessile. The polyp was removed with a  cold snare. Resection and retrieval were complete.                            Estimated blood loss was minimal.                           A 4 mm polyp was found in the rectum. The polyp was                            sessile. The polyp was removed with a cold snare.                            Resection and retrieval were complete. Estimated                            blood loss was minimal.                           A few small-mouthed diverticula were found in the                            sigmoid colon.                           The exam was otherwise normal throughout the                            examined colon.                           The terminal ileum appeared normal.                           Non-bleeding internal hemorrhoids were found during                            retroflexion. The hemorrhoids were Grade I                             (internal hemorrhoids that do not prolapse).                           No additional abnormalities were found on                            retroflexion. Complications:            No immediate complications. Estimated Blood Loss:     Estimated blood loss was minimal. Impression:               - Preparation of the colon was fair, that there was                            copious liquid brown stool throughout the entire  colon, but in the cecum and ascending colon there                            were areas of adherent stool that did not cleanse.                           - One 4 mm polyp in the sigmoid colon, removed with                            a cold snare. Resected and retrieved.                           - One 4 mm polyp in the rectum, removed with a cold                            snare. Resected and retrieved.                           - Diverticulosis in the sigmoid colon.                           - The examined portion of the ileum was normal.                           - Non-bleeding internal hemorrhoids. Recommendation:           - Patient has a contact number available for                            emergencies. The signs and symptoms of potential                            delayed complications were discussed with the                            patient. Return to normal activities tomorrow.                            Written discharge instructions were provided to the                            patient.                           - Resume previous diet.                           - Continue present medications.                           - Await pathology results.                           - Repeat colonoscopy in 3 years because the bowel  preparation was suboptimal. Indra Wolters E. Candis Schatz, MD 07/09/2021 10:47:02 AM This report has been signed electronically.

## 2021-07-09 NOTE — Progress Notes (Signed)
Patient is very restless. States l shoulder pain from previous surgery. Semi-fowlers position.  Patient is swearing. Dr aware.  Patient did not respond to any discharge instructions.  Read report at this time.  Stated would "eat what he wants."

## 2021-07-09 NOTE — Progress Notes (Signed)
PT taken to PACU. Monitors in place. VSS. Report given to RN. 

## 2021-07-14 ENCOUNTER — Telehealth: Payer: Self-pay | Admitting: *Deleted

## 2021-07-14 NOTE — Telephone Encounter (Signed)
°  Follow up Call-  Call back number 07/09/2021  Post procedure Call Back phone  # [phone]  Permission to leave phone message Yes  Some recent data might be hidden     Patient questions:  Do you have a fever, pain , or abdominal swelling? No. Pain Score  0 *  Have you tolerated food without any problems? Yes.    Have you been able to return to your normal activities? Yes.    Do you have any questions about your discharge instructions: Diet   No. Medications  No. Follow up visit  No.  Do you have questions or concerns about your Care? No.  Actions: * If pain score is 4 or above: No action needed, pain <4.  Have you developed a fever since your procedure? no  2.   Have you had an respiratory symptoms (SOB or cough) since your procedure? no  3.   Have you tested positive for COVID 19 since your procedure no  4.   Have you had any family members/close contacts diagnosed with the COVID 19 since your procedure?  no   If yes to any of these questions please route to Joylene John, RN and Joella Prince, RN

## 2021-07-19 ENCOUNTER — Encounter: Payer: Self-pay | Admitting: Gastroenterology

## 2022-10-03 ENCOUNTER — Other Ambulatory Visit: Payer: Self-pay | Admitting: Orthopedic Surgery

## 2022-10-03 DIAGNOSIS — Z09 Encounter for follow-up examination after completed treatment for conditions other than malignant neoplasm: Secondary | ICD-10-CM

## 2022-10-04 ENCOUNTER — Ambulatory Visit
Admission: RE | Admit: 2022-10-04 | Discharge: 2022-10-04 | Disposition: A | Discharge status: Home / Self Care | Payer: 59 | Source: Ambulatory Visit | Attending: Orthopedic Surgery | Admitting: Orthopedic Surgery

## 2022-10-04 DIAGNOSIS — Z09 Encounter for follow-up examination after completed treatment for conditions other than malignant neoplasm: Secondary | ICD-10-CM

## 2023-04-12 ENCOUNTER — Other Ambulatory Visit: Payer: Self-pay | Admitting: Family Medicine

## 2023-04-12 DIAGNOSIS — R1011 Right upper quadrant pain: Secondary | ICD-10-CM

## 2023-04-18 ENCOUNTER — Ambulatory Visit
Admission: RE | Admit: 2023-04-18 | Discharge: 2023-04-18 | Disposition: A | Discharge status: Home / Self Care | Payer: 59 | Source: Ambulatory Visit | Attending: Family Medicine | Admitting: Family Medicine

## 2023-04-18 DIAGNOSIS — R1011 Right upper quadrant pain: Secondary | ICD-10-CM
